# Patient Record
Sex: Female | Born: 1935 | Race: White | Hispanic: No | Marital: Single | Smoking: Former smoker
Health system: Southern US, Community
[De-identification: ages and names within clinical notes are randomized; demographics above are authoritative.]

## PROBLEM LIST (undated history)

## (undated) DIAGNOSIS — A879 Viral meningitis, unspecified: Secondary | ICD-10-CM

## (undated) DIAGNOSIS — I10 Essential (primary) hypertension: Secondary | ICD-10-CM

## (undated) HISTORY — PX: TONSILLECTOMY: SUR1361

---

## 2011-11-13 DIAGNOSIS — I10 Essential (primary) hypertension: Secondary | ICD-10-CM | POA: Diagnosis not present

## 2011-11-13 DIAGNOSIS — M549 Dorsalgia, unspecified: Secondary | ICD-10-CM | POA: Diagnosis not present

## 2011-11-13 DIAGNOSIS — B9789 Other viral agents as the cause of diseases classified elsewhere: Secondary | ICD-10-CM | POA: Diagnosis not present

## 2011-11-13 DIAGNOSIS — H73009 Acute myringitis, unspecified ear: Secondary | ICD-10-CM | POA: Diagnosis not present

## 2012-02-12 DIAGNOSIS — I10 Essential (primary) hypertension: Secondary | ICD-10-CM | POA: Diagnosis not present

## 2012-05-13 DIAGNOSIS — I10 Essential (primary) hypertension: Secondary | ICD-10-CM | POA: Diagnosis not present

## 2012-08-18 DIAGNOSIS — I10 Essential (primary) hypertension: Secondary | ICD-10-CM | POA: Diagnosis not present

## 2012-11-24 DIAGNOSIS — I10 Essential (primary) hypertension: Secondary | ICD-10-CM | POA: Diagnosis not present

## 2013-02-23 DIAGNOSIS — I10 Essential (primary) hypertension: Secondary | ICD-10-CM | POA: Diagnosis not present

## 2013-06-01 DIAGNOSIS — I10 Essential (primary) hypertension: Secondary | ICD-10-CM | POA: Diagnosis not present

## 2013-09-17 DIAGNOSIS — I10 Essential (primary) hypertension: Secondary | ICD-10-CM | POA: Diagnosis not present

## 2013-12-28 DIAGNOSIS — I10 Essential (primary) hypertension: Secondary | ICD-10-CM | POA: Diagnosis not present

## 2014-04-19 DIAGNOSIS — I10 Essential (primary) hypertension: Secondary | ICD-10-CM | POA: Diagnosis not present

## 2014-07-20 DIAGNOSIS — Z1331 Encounter for screening for depression: Secondary | ICD-10-CM | POA: Diagnosis not present

## 2014-07-20 DIAGNOSIS — Z0000 Encounter for general adult medical examination without abnormal findings: Secondary | ICD-10-CM | POA: Diagnosis not present

## 2014-07-20 DIAGNOSIS — I10 Essential (primary) hypertension: Secondary | ICD-10-CM | POA: Diagnosis not present

## 2014-10-21 DIAGNOSIS — I10 Essential (primary) hypertension: Secondary | ICD-10-CM | POA: Diagnosis not present

## 2014-10-21 DIAGNOSIS — M545 Low back pain: Secondary | ICD-10-CM | POA: Diagnosis not present

## 2015-01-21 DIAGNOSIS — I10 Essential (primary) hypertension: Secondary | ICD-10-CM | POA: Diagnosis not present

## 2015-01-21 DIAGNOSIS — M545 Low back pain: Secondary | ICD-10-CM | POA: Diagnosis not present

## 2015-01-21 DIAGNOSIS — Z131 Encounter for screening for diabetes mellitus: Secondary | ICD-10-CM | POA: Diagnosis not present

## 2015-04-22 DIAGNOSIS — I10 Essential (primary) hypertension: Secondary | ICD-10-CM | POA: Diagnosis not present

## 2015-04-22 DIAGNOSIS — M545 Low back pain: Secondary | ICD-10-CM | POA: Diagnosis not present

## 2015-07-25 DIAGNOSIS — I10 Essential (primary) hypertension: Secondary | ICD-10-CM | POA: Diagnosis not present

## 2015-07-25 DIAGNOSIS — Z1389 Encounter for screening for other disorder: Secondary | ICD-10-CM | POA: Diagnosis not present

## 2015-07-25 DIAGNOSIS — Z0000 Encounter for general adult medical examination without abnormal findings: Secondary | ICD-10-CM | POA: Diagnosis not present

## 2015-07-25 DIAGNOSIS — M545 Low back pain: Secondary | ICD-10-CM | POA: Diagnosis not present

## 2015-07-25 DIAGNOSIS — Z23 Encounter for immunization: Secondary | ICD-10-CM | POA: Diagnosis not present

## 2015-07-25 DIAGNOSIS — N181 Chronic kidney disease, stage 1: Secondary | ICD-10-CM | POA: Diagnosis not present

## 2015-08-02 DIAGNOSIS — Z1211 Encounter for screening for malignant neoplasm of colon: Secondary | ICD-10-CM | POA: Diagnosis not present

## 2015-10-24 DIAGNOSIS — M545 Low back pain: Secondary | ICD-10-CM | POA: Diagnosis not present

## 2015-10-24 DIAGNOSIS — N181 Chronic kidney disease, stage 1: Secondary | ICD-10-CM | POA: Diagnosis not present

## 2015-10-24 DIAGNOSIS — Z131 Encounter for screening for diabetes mellitus: Secondary | ICD-10-CM | POA: Diagnosis not present

## 2015-10-24 DIAGNOSIS — I10 Essential (primary) hypertension: Secondary | ICD-10-CM | POA: Diagnosis not present

## 2016-01-26 DIAGNOSIS — M545 Low back pain: Secondary | ICD-10-CM | POA: Diagnosis not present

## 2016-01-26 DIAGNOSIS — I10 Essential (primary) hypertension: Secondary | ICD-10-CM | POA: Diagnosis not present

## 2016-01-26 DIAGNOSIS — N181 Chronic kidney disease, stage 1: Secondary | ICD-10-CM | POA: Diagnosis not present

## 2016-08-23 ENCOUNTER — Inpatient Hospital Stay (HOSPITAL_COMMUNITY)
Admission: AD | Admit: 2016-08-23 | Discharge: 2016-08-27 | DRG: 071 | Disposition: A | Discharge status: Home Health | Payer: Medicare Other | Source: Other Acute Inpatient Hospital | Attending: Family Medicine | Admitting: Family Medicine

## 2016-08-23 ENCOUNTER — Encounter (HOSPITAL_COMMUNITY): Payer: Self-pay | Admitting: Internal Medicine

## 2016-08-23 DIAGNOSIS — I639 Cerebral infarction, unspecified: Secondary | ICD-10-CM | POA: Diagnosis not present

## 2016-08-23 DIAGNOSIS — R748 Abnormal levels of other serum enzymes: Secondary | ICD-10-CM | POA: Diagnosis not present

## 2016-08-23 DIAGNOSIS — I248 Other forms of acute ischemic heart disease: Secondary | ICD-10-CM | POA: Diagnosis present

## 2016-08-23 DIAGNOSIS — E860 Dehydration: Secondary | ICD-10-CM | POA: Diagnosis not present

## 2016-08-23 DIAGNOSIS — R4701 Aphasia: Secondary | ICD-10-CM | POA: Diagnosis present

## 2016-08-23 DIAGNOSIS — N390 Urinary tract infection, site not specified: Secondary | ICD-10-CM | POA: Diagnosis not present

## 2016-08-23 DIAGNOSIS — T17910A Gastric contents in respiratory tract, part unspecified causing asphyxiation, initial encounter: Secondary | ICD-10-CM | POA: Diagnosis not present

## 2016-08-23 DIAGNOSIS — G9340 Encephalopathy, unspecified: Secondary | ICD-10-CM | POA: Diagnosis not present

## 2016-08-23 DIAGNOSIS — J338 Other polyp of sinus: Secondary | ICD-10-CM | POA: Diagnosis not present

## 2016-08-23 DIAGNOSIS — R1110 Vomiting, unspecified: Secondary | ICD-10-CM | POA: Diagnosis not present

## 2016-08-23 DIAGNOSIS — G459 Transient cerebral ischemic attack, unspecified: Secondary | ICD-10-CM

## 2016-08-23 DIAGNOSIS — I10 Essential (primary) hypertension: Secondary | ICD-10-CM | POA: Diagnosis not present

## 2016-08-23 DIAGNOSIS — D72829 Elevated white blood cell count, unspecified: Secondary | ICD-10-CM | POA: Diagnosis not present

## 2016-08-23 DIAGNOSIS — D385 Neoplasm of uncertain behavior of other respiratory organs: Secondary | ICD-10-CM | POA: Diagnosis not present

## 2016-08-23 DIAGNOSIS — Z8249 Family history of ischemic heart disease and other diseases of the circulatory system: Secondary | ICD-10-CM | POA: Diagnosis not present

## 2016-08-23 DIAGNOSIS — J3489 Other specified disorders of nose and nasal sinuses: Secondary | ICD-10-CM | POA: Diagnosis not present

## 2016-08-23 DIAGNOSIS — R4182 Altered mental status, unspecified: Secondary | ICD-10-CM | POA: Diagnosis not present

## 2016-08-23 DIAGNOSIS — R29898 Other symptoms and signs involving the musculoskeletal system: Secondary | ICD-10-CM | POA: Diagnosis present

## 2016-08-23 DIAGNOSIS — J392 Other diseases of pharynx: Secondary | ICD-10-CM | POA: Diagnosis not present

## 2016-08-23 DIAGNOSIS — R778 Other specified abnormalities of plasma proteins: Secondary | ICD-10-CM | POA: Diagnosis present

## 2016-08-23 DIAGNOSIS — D491 Neoplasm of unspecified behavior of respiratory system: Secondary | ICD-10-CM | POA: Diagnosis not present

## 2016-08-23 DIAGNOSIS — R7989 Other specified abnormal findings of blood chemistry: Secondary | ICD-10-CM | POA: Diagnosis present

## 2016-08-23 DIAGNOSIS — I6789 Other cerebrovascular disease: Secondary | ICD-10-CM | POA: Diagnosis not present

## 2016-08-23 DIAGNOSIS — R4781 Slurred speech: Secondary | ICD-10-CM | POA: Diagnosis not present

## 2016-08-23 DIAGNOSIS — A879 Viral meningitis, unspecified: Secondary | ICD-10-CM | POA: Insufficient documentation

## 2016-08-23 DIAGNOSIS — R413 Other amnesia: Secondary | ICD-10-CM | POA: Diagnosis not present

## 2016-08-23 HISTORY — DX: Viral meningitis, unspecified: A87.9

## 2016-08-23 HISTORY — DX: Essential (primary) hypertension: I10

## 2016-08-23 LAB — URINE MICROSCOPIC-ADD ON

## 2016-08-23 LAB — URINALYSIS, ROUTINE W REFLEX MICROSCOPIC
Glucose, UA: NEGATIVE mg/dL
Hgb urine dipstick: NEGATIVE
Ketones, ur: 15 mg/dL — AB
LEUKOCYTES UA: NEGATIVE
NITRITE: NEGATIVE
PH: 6 (ref 5.0–8.0)
Protein, ur: 100 mg/dL — AB
SPECIFIC GRAVITY, URINE: 1.023 (ref 1.005–1.030)

## 2016-08-23 LAB — APTT: APTT: 36 s (ref 24–36)

## 2016-08-23 LAB — PROTIME-INR
INR: 1.05
PROTHROMBIN TIME: 13.7 s (ref 11.4–15.2)

## 2016-08-23 MED ORDER — ENOXAPARIN SODIUM 40 MG/0.4ML ~~LOC~~ SOLN
40.0000 mg | SUBCUTANEOUS | Status: DC
  Administered 2016-08-23 – 2016-08-26 (×4): 40 mg via SUBCUTANEOUS
  Filled 2016-08-23 (×4): qty 0.4

## 2016-08-23 MED ORDER — ASPIRIN 325 MG PO TABS
325.0000 mg | ORAL_TABLET | Freq: Every day | ORAL | Status: DC
  Administered 2016-08-23 – 2016-08-27 (×5): 325 mg via ORAL
  Filled 2016-08-23 (×5): qty 1

## 2016-08-23 MED ORDER — ACETAMINOPHEN 325 MG PO TABS
650.0000 mg | ORAL_TABLET | Freq: Four times a day (QID) | ORAL | Status: DC | PRN
  Administered 2016-08-24 – 2016-08-26 (×4): 650 mg via ORAL
  Filled 2016-08-23 (×4): qty 2

## 2016-08-23 MED ORDER — STROKE: EARLY STAGES OF RECOVERY BOOK
Freq: Once | Status: AC
  Administered 2016-08-23: 22:00:00

## 2016-08-23 MED ORDER — SODIUM CHLORIDE 0.9 % IV SOLN
INTRAVENOUS | Status: DC
  Administered 2016-08-23: 1000 mL via INTRAVENOUS
  Administered 2016-08-25 – 2016-08-26 (×2): via INTRAVENOUS

## 2016-08-23 MED ORDER — ZOLPIDEM TARTRATE 5 MG PO TABS: 5.0000 mg | ORAL_TABLET | Freq: Every evening | ORAL | Status: DC | PRN

## 2016-08-23 MED ORDER — ONDANSETRON HCL 4 MG/2ML IJ SOLN: 4.0000 mg | Freq: Three times a day (TID) | INTRAMUSCULAR | Status: DC | PRN

## 2016-08-23 MED ORDER — ASPIRIN 300 MG RE SUPP: 300.0000 mg | Freq: Every day | RECTAL | Status: DC

## 2016-08-23 MED ORDER — HYDRALAZINE HCL 20 MG/ML IJ SOLN: 5.0000 mg | INTRAMUSCULAR | Status: DC | PRN

## 2016-08-23 MED ORDER — SENNOSIDES-DOCUSATE SODIUM 8.6-50 MG PO TABS: 1.0000 | ORAL_TABLET | Freq: Every evening | ORAL | Status: DC | PRN

## 2016-08-23 NOTE — Progress Notes (Signed)
Pt arrived on unit from [facility] hospital via [place]. Pt confused on arrival with receptive and expressive aphasia. Pt's grand-daughter present with patient and made aware of call button and phone in unit.

## 2016-08-23 NOTE — H&P (Addendum)
History and Physical    Kristi Graham [id number] DOB: May 01, 1933 DOA: 08/23/2016  Referring MD/NP/PA:   PCP: No primary care provider on file.   Patient coming from:  The patient is coming from home.  At baseline, pt is independent for most of ADL. SNF  Assistant living facility   [facility].       Chief Complaint: AMS, slurred speech and right arm weakness  HPI: Kristi Graham is a 82 y.o. female with medical history significant of hypertension, viral meningitis, who presents with altered mental status, slurred speech and right arm weakness.  Pt is transferred [facility].   Per pt's daugher, at baseline patient is able to perform all of her ADLs and is checked on from time to time by family. Pt was last know normal yesterday afternoon. She was found down surrounded with vomit by family at about 10:00 AM, for an unknown period of time.  Pt is confused. Pt was noted to have right arm and leg weakness and slurred speech. Per accepting Doctor, Dr. Baker Janus note, "in the ED patient was found to have 3 out of 5 strength in her right upper extremity and right lower extremity with 5 out of 5 strength on the contralateral side". She dose not seem to have vision loss or hearing loss. Per family, patient was not taking her blood pressure medications because it made her dizzy recently. 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 MRI of brain is negative. MRA of brain is stable since 2012 and negative.   When I saw pt on the floor, she is still confused, denies any pain anywhere, No chest pain or abdominal pain. She moves all extremities. No active coughing, nausea, vomiting or diarrhea noted.  ED Course: pt was found to have  WBC 14.0, electrolytes and renal function okay, negative chest x-ray,  pending CT, temperature normal, slightly tachycardia. Pt is admitted to tele bed as inpt.  Review of Systems: Could not be reviewed due to altered mental status.  Allergy: Allergies not on file  Past Medical History:  Diagnosis Date  . Essential hypertension   . Viral meningitis     Past Surgical History:  Procedure Laterality Date  . TONSILLECTOMY      Social History:  Could not be reviewed due to altered mental status  Family History:  Family History  Problem Relation Age of Onset  . Dementia Mother   . Heart attack Father      Prior to Admission medications   Not on File    Physical Exam: Vitals:   08/23/16 2014 08/23/16 2200 08/24/16 0000 08/24/16 0200  BP: (!) 152/69 137/65 (!) 122/45 (!) 135/56  Pulse: 97 88 78 82  Resp: 18 18 16 18   Temp: 99.5 F (37.5 C) 99 F (37.2 C) 97.7 F (36.5 C) 99.1 F (37.3 C)  TempSrc: Oral Oral Oral Oral  SpO2: 99% 96% 97% 96%  Weight:   94.1 kg (207 lb 6.4 oz)   Height:   5\' 7"  (1.702 m)    General: Not in acute distress HEENT:       Eyes: PERRL, EOMI, no scleral icterus.       ENT: No discharge from the ears and nose, no pharynx injection, no tonsillar enlargement.        Neck: No JVD, no bruit, no  mass felt. Heme: No neck lymph node enlargement. Cardiac: S1/S2, RRR, No murmurs, No gallops or rubs. Respiratory: No rales, wheezing, rhonchi or rubs. GI: Soft, nondistended, nontender, no rebound pain, no organomegaly, BS present. GU: No hematuria Ext: No pitting leg edema bilaterally. 2+DP/PT pulse bilaterally. Musculoskeletal: No joint deformities, No joint redness or warmth, no limitation of ROM in spin. Skin: No rashes.  Neuro: confused, not oriented X3, cranial nerves II-XII grossly intact, moves all extremities normally. Muscle strength 5/5 in all extremities.  Knee reflex 1+ bilaterally. Negative Babinski's sign. Neck supple. Psych: Could not be reviewed due to altered mental status.  Labs on Admission: I have  personally reviewed following labs and imaging studies  CBC: No results for input(s): WBC, NEUTROABS, HGB, HCT, MCV, PLT in the last 168 hours. Basic Metabolic Panel: No results for input(s): NA, K, CL, CO2, GLUCOSE, BUN, CREATININE, CALCIUM, MG, PHOS in the last 168 hours. GFR: CrCl cannot be calculated (No order found.). Liver Function Tests: No results for input(s): AST, ALT, ALKPHOS, BILITOT, PROT, ALBUMIN in the last 168 hours.  Recent Labs Lab 08/23/16 2247  LIPASE 22   No results for input(s): AMMONIA in the last 168 hours. Coagulation Profile:  Recent Labs Lab 08/23/16 2247  INR 1.05   Cardiac Enzymes:  Recent Labs Lab 08/23/16 2247  TROPONINI 0.03*   BNP (last 3 results) No results for input(s): PROBNP in the last 8760 hours. HbA1C: No results for input(s): HGBA1C in the last 72 hours. CBG: No results for input(s): GLUCAP in the last 168 hours. Lipid Profile:  Recent Labs  08/24/16 0205  CHOL 159  HDL 65  LDLCALC 85  TRIG 47  CHOLHDL 2.4   Thyroid Function Tests: No results for input(s): TSH, T4TOTAL, FREET4, T3FREE, THYROIDAB in the last 72 hours. Anemia Panel: No results for input(s): VITAMINB12, FOLATE, FERRITIN, TIBC, IRON, RETICCTPCT in the last 72 hours. Urine analysis:    Component Value Date/Time   COLORURINE AMBER (A) 08/23/2016 2255   APPEARANCEUR CLOUDY (A) 08/23/2016 2255   LABSPEC 1.023 08/23/2016 2255   PHURINE 6.0 08/23/2016 2255   GLUCOSEU NEGATIVE 08/23/2016 2255   HGBUR NEGATIVE 08/23/2016 2255   BILIRUBINUR SMALL (A) 08/23/2016 2255   KETONESUR 15 (A) 08/23/2016 2255   PROTEINUR 100 (A) 08/23/2016 2255   NITRITE NEGATIVE 08/23/2016 2255   LEUKOCYTESUR NEGATIVE 08/23/2016 2255   Sepsis Labs: @LABRCNTIP (procalcitonin:4,lacticidven:4) )No results found for this or any previous visit (from the past 240 hour(s)).   Radiological Exams on Admission: No results found.   EKG: Independently reviewed.QTC 498, tachycardia,  LAD, anteroseptal infarction pattern.  Assessment/Plan Principal Problem:   Acute encephalopathy Active Problems:   Essential hypertension   Receptive aphasia   Right arm weakness   Leukocytosis   Elevated troponin   Acute encephalopathy, right arm weakness and receptive aphasia: Etiology is not clear. MRI/MRA did not show acute new issues intracranially. Potential differential diagnosis includes TIA, seizure. Patient does not have fever, neck supple, less likely to have meningitis. --Admitted to telemetry bed as inpatient -TIA workup - Risk factor modification: HgbA1c, fasting lipid panel - PT consult, OT consult - Bedside swallowing screen was ordered, will get speech consult in AM - 2 d Echocardiogram  - Carotid dopplers  - check EEG - please call neurology in morning  Essential hypertension: -hold amlodipin due to AMS -IV hydralazine when necessary  Leukocytosis: Etiology is not clear. CXR is negative, urinalysis negative. May be due to stress-induced demargination -Follow-up CBC  Elevated  troponin: trop 0.03. Likely due to demand ischemia. No chest pain. 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 -Please call ENT for consultation   DVT ppx: SQ Heparin   Code Status: Full code Family Communication: Yes, patient's  Daughter, sister and granddaughter  at bed side Disposition Plan:  Anticipate discharge back to previous home environment Consults called:  none Admission status: Inpatient/tele    Date of Service 08/24/2016    Ivor Costa [facility] Pager [phone]  If 7PM-7AM, please contact night-coverage [email or web address] Password [email or web address] 08/24/2016, 3:39 AM

## 2016-08-24 ENCOUNTER — Inpatient Hospital Stay (HOSPITAL_COMMUNITY): Payer: Medicare Other

## 2016-08-24 ENCOUNTER — Inpatient Hospital Stay (HOSPITAL_COMMUNITY)
Admission: AD | Admit: 2016-08-24 | Discharge: 2016-08-24 | Disposition: A | Discharge status: Home / Self Care | Payer: Medicare Other | Source: Other Acute Inpatient Hospital | Attending: Internal Medicine | Admitting: Internal Medicine

## 2016-08-24 ENCOUNTER — Encounter (HOSPITAL_COMMUNITY): Payer: Self-pay | Admitting: Internal Medicine

## 2016-08-24 DIAGNOSIS — R4182 Altered mental status, unspecified: Secondary | ICD-10-CM

## 2016-08-24 DIAGNOSIS — G9340 Encephalopathy, unspecified: Secondary | ICD-10-CM

## 2016-08-24 DIAGNOSIS — R778 Other specified abnormalities of plasma proteins: Secondary | ICD-10-CM | POA: Diagnosis present

## 2016-08-24 DIAGNOSIS — R748 Abnormal levels of other serum enzymes: Secondary | ICD-10-CM

## 2016-08-24 DIAGNOSIS — R7989 Other specified abnormal findings of blood chemistry: Secondary | ICD-10-CM

## 2016-08-24 LAB — CBC WITH DIFFERENTIAL/PLATELET
BASOS PCT: 1 %
Basophils Absolute: 0.1 10*3/uL (ref 0.0–0.1)
EOS ABS: 0.1 10*3/uL (ref 0.0–0.7)
Eosinophils Relative: 1 %
HCT: 38 % (ref 36.0–46.0)
HEMOGLOBIN: 12.1 g/dL (ref 12.0–15.0)
Lymphocytes Relative: 26 %
Lymphs Abs: 3 10*3/uL (ref 0.7–4.0)
MCH: 26.1 pg (ref 26.0–34.0)
MCHC: 31.8 g/dL (ref 30.0–36.0)
MCV: 82.1 fL (ref 78.0–100.0)
MONO ABS: 0.7 10*3/uL (ref 0.1–1.0)
MONOS PCT: 6 %
NEUTROS PCT: 66 %
Neutro Abs: 7.7 10*3/uL (ref 1.7–7.7)
Platelets: 308 10*3/uL (ref 150–400)
RBC: 4.63 MIL/uL (ref 3.87–5.11)
RDW: 15.5 % (ref 11.5–15.5)
WBC: 11.5 10*3/uL — ABNORMAL HIGH (ref 4.0–10.5)

## 2016-08-24 LAB — COMPREHENSIVE METABOLIC PANEL WITH GFR
ALT: 24 U/L (ref 14–54)
AST: 58 U/L — ABNORMAL HIGH (ref 15–41)
Albumin: 3.7 g/dL (ref 3.5–5.0)
Alkaline Phosphatase: 54 U/L (ref 38–126)
Anion gap: 9 (ref 5–15)
BUN: 29 mg/dL — ABNORMAL HIGH (ref 6–20)
CO2: 23 mmol/L (ref 22–32)
Calcium: 9 mg/dL (ref 8.9–10.3)
Chloride: 104 mmol/L (ref 101–111)
Creatinine, Ser: 1.11 mg/dL — ABNORMAL HIGH (ref 0.44–1.00)
GFR calc Af Amer: 53 mL/min — ABNORMAL LOW
GFR calc non Af Amer: 46 mL/min — ABNORMAL LOW
Glucose, Bld: 116 mg/dL — ABNORMAL HIGH (ref 65–99)
Potassium: 3.4 mmol/L — ABNORMAL LOW (ref 3.5–5.1)
Sodium: 136 mmol/L (ref 135–145)
Total Bilirubin: 0.8 mg/dL (ref 0.3–1.2)
Total Protein: 7.2 g/dL (ref 6.5–8.1)

## 2016-08-24 LAB — TROPONIN I
TROPONIN I: 0.03 ng/mL — AB (ref <0.03)
Troponin I: 0.03 ng/mL (ref <0.03)

## 2016-08-24 LAB — AMMONIA: Ammonia: 21 umol/L (ref 9–35)

## 2016-08-24 LAB — HEMOGLOBIN A1C
Hgb A1c MFr Bld: 5.7 % — ABNORMAL HIGH (ref 4.8–5.6)
MEAN PLASMA GLUCOSE: 117 mg/dL

## 2016-08-24 LAB — LIPID PANEL
CHOL/HDL RATIO: 2.4 ratio
CHOLESTEROL: 159 mg/dL (ref 0–200)
HDL: 65 mg/dL (ref >40)
LDL Cholesterol: 85 mg/dL (ref 0–99)
TRIGLYCERIDES: 47 mg/dL (ref <150)
VLDL: 9 mg/dL (ref 0–40)

## 2016-08-24 LAB — VITAMIN B12: VITAMIN B 12: 215 pg/mL (ref 180–914)

## 2016-08-24 LAB — GLUCOSE, CAPILLARY: Glucose-Capillary: 96 mg/dL (ref 65–99)

## 2016-08-24 LAB — LIPASE, BLOOD: Lipase: 22 U/L (ref 11–51)

## 2016-08-24 LAB — TSH: TSH: 1.031 u[IU]/mL (ref 0.350–4.500)

## 2016-08-24 MED ORDER — NITROGLYCERIN 0.4 MG SL SUBL: 0.4000 mg | SUBLINGUAL_TABLET | SUBLINGUAL | Status: DC | PRN

## 2016-08-24 MED ORDER — MORPHINE SULFATE (PF) 2 MG/ML IV SOLN: 2.0000 mg | INTRAVENOUS | Status: DC | PRN

## 2016-08-24 NOTE — Progress Notes (Signed)
PROGRESS NOTE    Kristi Graham  [id number] DOB: July 08, 1933 DOA: 08/23/2016 PCP: No primary care provider on file.    Brief Narrative:  Kristi Graham is a 82 y.o. female with medical history significant of hypertension, viral meningitis, who presents with altered mental status, slurred speech and right arm weakness.  Pt is transferred [facility].   Per pt's daugher, at baseline patient is able to perform all of her ADLs and is checked on from time to time by family. Pt was last know normal yesterday afternoon. She was found down surrounded with vomit by family at about 10:00 AM, for an unknown period of time.  Pt is confused. Pt was noted to have right arm and leg weakness and slurred speech. Per accepting Doctor, Dr. Baker Janus note, "in the ED patient was found to have 3 out of 5 strength in her right upper extremity and right lower extremity with 5 out of 5 strength on the contralateral side". She dose not seem to have vision loss or hearing loss. Per family, patient was not taking her blood pressure medications because it made her dizzy recently. 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 MRI of brain is negative. MRA of brain is stable since 2012 and negative.  Upon arrival patient is still confused, denies any pain anywhere, No chest pain or abdominal pain. She moves all extremities. No active coughing, nausea, vomiting or diarrhea noted.  Patient was found to have  WBC 14.0, electrolytes and renal function okay, negative chest x-ray, pending CT, temperature normal, slightly tachycardia. Pt is admitted to tele bed as inpt.    Assessment & Plan:   Principal Problem:   Acute encephalopathy Active Problems:   Essential hypertension   Receptive aphasia   Right arm weakness  Leukocytosis   Elevated troponin   Acute encephalopathy, right arm weakness and receptive aphasia: Etiology is not clear. MRI/MRA did not show acute new issues intracranially. Potential differential diagnosis includes TIA, seizure. Patient does not have fever, neck supple, less likely to have meningitis. - Admitted to telemetry bed as inpatient - TIA workup - Risk factor modification: HgbA1c, fasting lipid panel - PT consult, OT consult - 2 d Echocardiogram pending - Carotid dopplers pending - check EEG - Neurology consulted - vitamin B12 ordered - repeat MRI - TSH and RPR ordered  Essential hypertension: -hold amlodipine due to AMS -IV hydralazine when necessary  Leukocytosis: Etiology is not clear. CXR is negative, urinalysis negative. May be due to stress-induced demargination - WBC at 11.5 this morning  Elevated troponin: trop 0.03. Likely due to demand ischemia. No chest pain. 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 -ENT consulted- Dr. Melrose Nakayama to see patient   DVT ppx: SQ Heparin   Code Status: Full code Family Communication: Yes, patient's  Daughter, sister and granddaughter  at bed side Disposition Plan:  unclear at this time- pending therapy recommendations   Consultants:   Neurology  ENT  PT/OT  Procedures:   none  Antimicrobials:   none    Subjective: Patient seen and evaluated this morning.  She was confused and unable to follow two step directions.  Unable to provide a history.  Granddaughter is bedside. Mass found on CT scan at [facility] in maxillary sinus but not reported on MRI.  No respiratory compromise.   Objective: Vitals:   08/24/16 0400 08/24/16 0600 08/24/16 1038 08/24/16 1415  BP: (!)  126/58 (!) 122/55 (!) 147/56 130/62  Pulse: 89 79 74 77  Resp: 18 18 16 18   Temp: 98.1 F (36.7 C) 97.7 F (36.5 C) 97.8 F (36.6 C) 98 F (36.7 C)  TempSrc: Oral Oral Oral Oral  SpO2: 97% 97% 95%   Weight:      Height:        Intake/Output Summary (Last 24 hours) at 08/24/16 1542 Last data filed at 08/24/16 1038  Gross per 24 hour  Intake             1260 ml  Output              200 ml  Net             1060 ml   Filed Weights   08/24/16 0000  Weight: 94.1 kg (207 lb 6.4 oz)    Examination:  General exam: Appears calm and comfortable  Respiratory system: Clear to auscultation. Respiratory effort normal. Cardiovascular system: S1 & S2 heard, RRR. No JVD, murmurs, rubs, gallops or clicks. No pedal edema. Gastrointestinal system: Abdomen is nondistended, soft and nontender. No organomegaly or masses felt. Normal bowel sounds heard. Central nervous system: Alert but not oriented. Questionable deficits but patient not fully participatory in exam. Extremities: patient seems to be able to move upper and lower extremities spontaneously and could flex and extend at elbow and wrist and ankle but could not follow commands Skin: No rashes, lesions or ulcers Psychiatry: limited insight. Mood & affect appropriate.     Data Reviewed: I have personally reviewed following labs and imaging studies  CBC:  Recent Labs Lab 08/24/16 1134  WBC 11.5*  NEUTROABS 7.7  HGB 12.1  HCT 38.0  MCV 82.1  PLT [id number]   Basic Metabolic Panel:  Recent Labs Lab 08/24/16 1134  NA 136  K 3.4*  CL 104  CO2 23  GLUCOSE 116*  BUN 29*  CREATININE 1.11*  CALCIUM 9.0   GFR: Estimated Creatinine Clearance: 47.6 mL/min (by C-G formula based on SCr of 1.11 mg/dL (H)). Liver Function Tests:  Recent Labs Lab 08/24/16 1134  AST 58*  ALT 24  ALKPHOS 54  BILITOT 0.8  PROT 7.2  ALBUMIN 3.7    Recent Labs Lab 08/23/16 2247  LIPASE 22    Recent Labs Lab 08/24/16 1134  AMMONIA 21    Coagulation Profile:  Recent Labs Lab 08/23/16 2247  INR 1.05   Cardiac Enzymes:  Recent Labs Lab 08/23/16 2247 08/24/16 0315 08/24/16 1134 08/24/16 1357  TROPONINI 0.03* 0.03* <0.03 <0.03   BNP (last 3 results) No results for input(s): PROBNP in the last 8760 hours. HbA1C: No results for input(s): HGBA1C in the last 72 hours. CBG:  Recent Labs Lab 08/24/16 0753  GLUCAP 96   Lipid Profile:  Recent Labs  08/24/16 0205  CHOL 159  HDL 65  LDLCALC 85  TRIG 47  CHOLHDL 2.4   Thyroid Function Tests:  Recent Labs  08/24/16 1134  TSH 1.031   Anemia Panel:  Recent Labs  08/24/16 1357  VITAMINB12 215   Sepsis Labs: No results for input(s): PROCALCITON, LATICACIDVEN in the last 168 hours.  No results found for this or any previous visit (from  the past 240 hour(s)).       Radiology Studies: No results found.      Scheduled Meds: . aspirin  300 mg Rectal Daily   Or  . aspirin  325 mg Oral Daily  . enoxaparin (LOVENOX) injection  40 mg Subcutaneous Q24H   Continuous Infusions: . sodium chloride 1,000 mL (08/23/16 2148)     LOS: 1 day    Time spent: 35 minutes    Newman Pies, MD [facility] Pager [phone]  If 7PM-7AM, please contact night-coverage [email or web address] Password [email or web address] 08/24/2016, 3:42 PM

## 2016-08-24 NOTE — Progress Notes (Signed)
PT Cancellation Note  Patient Details Name: Lorenza Propes MRN: [id number] DOB: 25-Mar-1933   Cancelled Treatment:    Reason Eval/Treat Not Completed: Patient not medically ready Pt on bedrest. Will await increase in activity orders prior to initiation of PT evaluation. Will follow.   Marguarite Arbour A Shauniece Kwan 08/24/2016, 8:32 AM Wray Kearns, PT, DPT [phone]

## 2016-08-24 NOTE — Progress Notes (Signed)
Preliminary results by tech: Carotid duplex complete. Right 1-39 percent ICA stenosis. Left 40-59 percent stenosis. Vertebral arteries antegrade bilaterally. [place], RVT 3:56 PM  08/24/2016

## 2016-08-24 NOTE — Consult Note (Signed)
Reason for Consult right nasal mass Referring Physician: hospitalist  Kristi Graham is an 82 y.o. female.  HPI: Asked to see patient for a mass in the right maxillary sinus and ethmoid which was seen on CT and MRI scan with her stroke workup. She's recently been admitted for evaluation of mental status changes. She did not have clear evidence of stroke but has had changes that even worsened over the day today. She was referred from [facility]. She does have headaches and pressure in her face specifically on that side. She has had episodes of sinusitis in the past. The daughter states that she is bothered by the persisting congestion and pressure symptoms.  Past Medical History:  Diagnosis Date  . Essential hypertension   . Viral meningitis     Past Surgical History:  Procedure Laterality Date  . TONSILLECTOMY      Family History  Problem Relation Age of Onset  . Dementia Mother   . Heart attack Father     Social History:  reports that she has never smoked. She does not have any smokeless tobacco history on file. She reports that she does not drink alcohol or use drugs.  Allergies: No Known Allergies  Medications: I have reviewed the patient's current medications.  Results for orders placed or performed during the hospital encounter of 08/23/16 (from the past 48 hour(s))  Lipase, blood     Status: None   Collection Time: 08/23/16 10:47 PM  Result Value Ref Range   Lipase 22 11 - 51 U/L  Troponin I (q 6hr x 3)     Status: Abnormal   Collection Time: 08/23/16 10:47 PM  Result Value Ref Range   Troponin I 0.03 (HH) <0.03 ng/mL    Comment: CRITICAL RESULT CALLED TO, READ BACK BY AND VERIFIED WITH: BUSSEY,C RN 08/24/2016 0007 JORDANS   Protime-INR     Status: None   Collection Time: 08/23/16 10:47 PM  Result Value Ref Range   Prothrombin Time 13.7 11.4 - 15.2 seconds   INR 1.05   APTT     Status: None   Collection Time: 08/23/16 10:47 PM  Result Value Ref Range    aPTT 36 24 - 36 seconds  Urinalysis, Routine w reflex microscopic (not at [facility])     Status: Abnormal   Collection Time: 08/23/16 10:55 PM  Result Value Ref Range   Color, Urine AMBER (A) YELLOW    Comment: BIOCHEMICALS MAY BE AFFECTED BY COLOR   APPearance CLOUDY (A) CLEAR   Specific Gravity, Urine 1.023 1.005 - 1.030   pH 6.0 5.0 - 8.0   Glucose, UA NEGATIVE NEGATIVE mg/dL   Hgb urine dipstick NEGATIVE NEGATIVE   Bilirubin Urine SMALL (A) NEGATIVE   Ketones, ur 15 (A) NEGATIVE mg/dL   Protein, ur 100 (A) NEGATIVE mg/dL   Nitrite NEGATIVE NEGATIVE   Leukocytes, UA NEGATIVE NEGATIVE  Urine microscopic-add on     Status: Abnormal   Collection Time: 08/23/16 10:55 PM  Result Value Ref Range   Squamous Epithelial / LPF 6-30 (A) NONE SEEN   WBC, UA 0-5 0 - 5 WBC/hpf   RBC / HPF 0-5 0 - 5 RBC/hpf   Bacteria, UA FEW (A) NONE SEEN   Casts HYALINE CASTS (A) NEGATIVE   Urine-Other MUCOUS PRESENT   Lipid panel     Status: None   Collection Time: 08/24/16  2:05 AM  Result Value Ref Range   Cholesterol 159 0 - 200 mg/dL   Triglycerides 47 <  150 mg/dL   HDL 65 >40 mg/dL   Total CHOL/HDL Ratio 2.4 RATIO   VLDL 9 0 - 40 mg/dL   LDL Cholesterol 85 0 - 99 mg/dL    Comment:        Total Cholesterol/HDL:CHD Risk Coronary Heart Disease Risk Table                     Men   Women  1/2 Average Risk   3.4   3.3  Average Risk       5.0   4.4  2 X Average Risk   9.6   7.1  3 X Average Risk  23.4   11.0        Use the calculated Patient Ratio above and the CHD Risk Table to determine the patient's CHD Risk.        ATP III CLASSIFICATION (LDL):  <100     mg/dL   Optimal  100-129  mg/dL   Near or Above                    Optimal  130-159  mg/dL   Borderline  160-189  mg/dL   High  >190     mg/dL   Very High   Troponin I (q 6hr x 3)     Status: Abnormal   Collection Time: 08/24/16  3:15 AM  Result Value Ref Range   Troponin I 0.03 (HH) <0.03 ng/mL    Comment: CRITICAL VALUE NOTED.  VALUE  IS CONSISTENT WITH PREVIOUSLY REPORTED AND CALLED VALUE.  Glucose, capillary     Status: None   Collection Time: 08/24/16  7:53 AM  Result Value Ref Range   Glucose-Capillary 96 65 - 99 mg/dL  Troponin I (q 6hr x 3)     Status: None   Collection Time: 08/24/16 11:34 AM  Result Value Ref Range   Troponin I <0.03 <0.03 ng/mL  CBC with Differential/Platelet     Status: Abnormal   Collection Time: 08/24/16 11:34 AM  Result Value Ref Range   WBC 11.5 (H) 4.0 - 10.5 K/uL   RBC 4.63 3.87 - 5.11 MIL/uL   Hemoglobin 12.1 12.0 - 15.0 g/dL   HCT 38.0 36.0 - 46.0 %   MCV 82.1 78.0 - 100.0 fL   MCH 26.1 26.0 - 34.0 pg   MCHC 31.8 30.0 - 36.0 g/dL   RDW 15.5 11.5 - 15.5 %   Platelets 308 150 - 400 K/uL   Neutrophils Relative % 66 %   Neutro Abs 7.7 1.7 - 7.7 K/uL   Lymphocytes Relative 26 %   Lymphs Abs 3.0 0.7 - 4.0 K/uL   Monocytes Relative 6 %   Monocytes Absolute 0.7 0.1 - 1.0 K/uL   Eosinophils Relative 1 %   Eosinophils Absolute 0.1 0.0 - 0.7 K/uL   Basophils Relative 1 %   Basophils Absolute 0.1 0.0 - 0.1 K/uL  Comprehensive metabolic panel     Status: Abnormal   Collection Time: 08/24/16 11:34 AM  Result Value Ref Range   Sodium 136 135 - 145 mmol/L   Potassium 3.4 (L) 3.5 - 5.1 mmol/L   Chloride 104 101 - 111 mmol/L   CO2 23 22 - 32 mmol/L   Glucose, Bld 116 (H) 65 - 99 mg/dL   BUN 29 (H) 6 - 20 mg/dL   Creatinine, Ser 1.11 (H) 0.44 - 1.00 mg/dL   Calcium 9.0 8.9 - 10.3 mg/dL   Total Protein  7.2 6.5 - 8.1 g/dL   Albumin 3.7 3.5 - 5.0 g/dL   AST 58 (H) 15 - 41 U/L   ALT 24 14 - 54 U/L   Alkaline Phosphatase 54 38 - 126 U/L   Total Bilirubin 0.8 0.3 - 1.2 mg/dL   GFR calc non Af Amer 46 (L) >60 mL/min   GFR calc Af Amer 53 (L) >60 mL/min    Comment: (NOTE) The eGFR has been calculated using the CKD EPI equation. This calculation has not been validated in all clinical situations. eGFR's persistently <60 mL/min signify possible Chronic Kidney Disease.    Anion gap 9 5 -  15  Ammonia     Status: None   Collection Time: 08/24/16 11:34 AM  Result Value Ref Range   Ammonia 21 9 - 35 umol/L  TSH     Status: None   Collection Time: 08/24/16 11:34 AM  Result Value Ref Range   TSH 1.031 0.350 - 4.500 uIU/mL    Comment: Performed by a 3rd Generation assay with a functional sensitivity of <=0.01 uIU/mL.  Troponin I (q 6hr x 3)     Status: None   Collection Time: 08/24/16  1:57 PM  Result Value Ref Range   Troponin I <0.03 <0.03 ng/mL  Vitamin B12     Status: None   Collection Time: 08/24/16  1:57 PM  Result Value Ref Range   Vitamin B-12 215 180 - 914 pg/mL    Comment: (NOTE) This assay is not validated for testing neonatal or myeloproliferative syndrome specimens for Vitamin B12 levels.     Mr Jeri Cos Wo Contrast  Result Date: 08/24/2016 CLINICAL DATA:  Sudden onset confusion. Possible right-sided hemianopsia. EXAM: MRI HEAD WITHOUT AND WITH CONTRAST TECHNIQUE: Multiplanar, multiecho pulse sequences of the brain and surrounding structures were obtained without and with intravenous contrast. CONTRAST:  19 mL MultiHance COMPARISON:  Noncontrast brain MRI 08/23/2016 FINDINGS: Some sequences are mildly motion degraded. Brain: There is no evidence of acute infarct, intracranial hemorrhage, mass, midline shift, or extra-axial fluid collection. Mild generalized cerebral atrophy is within normal limits for age. Minimal cerebral white matter T2 signal changes are within normal limits for age. No abnormal enhancement is identified. Vascular: Major intracranial vascular flow voids are preserved. Skull and upper cervical spine: Mild nonspecific diffuse bone marrow heterogeneity. No destructive osseous lesion identified. Sinuses/Orbits: Unremarkable orbits. Minimal right frontal and mild right ethmoid sinus mucosal thickening. 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 Other:  None. IMPRESSION: Unremarkable appearance of the brain for age. Electronically Signed   By: Logan Bores M.D.   On: 08/24/2016 16:43    ROS Blood pressure 130/62, pulse 77, temperature 98 F (36.7 C), temperature source Oral, resp. rate 18, height 5' 7"  (1.702 m), weight 94.1 kg (207 lb 6.4 oz), SpO2 95 %. Physical Exam  Constitutional: She appears well-developed and well-nourished.  HENT:  Head: Normocephalic and atraumatic.  Mouth/Throat: Oropharynx is clear and moist.  Eyes: Conjunctivae are normal. Pupils are equal, round, and reactive to light.  Neck: Normal range of motion. Neck supple.    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 It has been present at least since 2012 based on her MRI scan review. It does not appear there is any significant bone erosion currently. She does have a lot of symptoms. The daughter was discussing this and wants to have it removed while she is  in the hospital. I discussed this with the hospital physician and they want to continue the workup and try to find out what her acute mental status change is prior to proceeding with a general anesthesia. She will need a fusion CT scan if that endoscopic sinus surgery removal does become an option. I will follow along and see how her status is through the weekend. Marland KitchenMelissa Montane 08/24/2016, 4:59 PM

## 2016-08-24 NOTE — Progress Notes (Signed)
PT Cancellation Note  Patient Details Name: Kristi Graham MRN: [id number] DOB: [birth year]   Cancelled Treatment:    Reason Eval/Treat Not Completed: Patient at procedure or test/unavailable Pt off floor at test. Will follow up next available time.   Marguarite Arbour A Tracyann Duffell 08/24/2016, 10:26 AM Wray Kearns, [place], DPT [phone]

## 2016-08-24 NOTE — Evaluation (Signed)
Physical Therapy Evaluation Patient Details Name: Maelani James MRN: [id number] DOB: 05/06/1933 Today's Date: 08/24/2016   History of Present Illness  Patient is a 82 y/o female with hx of essential HTN and viral meningitis presents with AMS, slurred speech and right arm weakness. MRI-negative. Workup pending.  Clinical Impression  Patient presents with confusion- demonstrating impaired safety awareness, difficulty following 2-3 step commands, impulsivity, and impaired awareness of situation. Per MD notes, pt independent PTA with family checking in on her daily. Today, pt with unsafe gait speed, running into doorways on left, difficulty following directional instructions and oriented x1-2. Concern about problem solving. Pt not safe to return home alone. Will need 24/7 supervision for safety. Would benefit from conversations about ALF and follow up with neurology. Will follow acutely to maximize independence and safe mobility.     Follow Up Recommendations Supervision for mobility/OOB;Supervision/Assistance - 24 hour    Equipment Recommendations  None recommended by PT    Recommendations for Other Services OT consult     Precautions / Restrictions Precautions Precautions: Fall Restrictions Weight Bearing Restrictions: No      Mobility  Bed Mobility               General bed mobility comments: Up in chair upon PT arrival.   Transfers Overall transfer level: Needs assistance Equipment used: None Transfers: Sit to/from Stand Sit to Stand: Supervision         General transfer comment: Supervision for safety due to impulsivity, no physical assist needed.  Ambulation/Gait Ambulation/Gait assistance: Min assist Ambulation Distance (Feet): 150 Feet Assistive device: None Gait Pattern/deviations: Staggering left;Staggering right;Step-through pattern Gait velocity: unsafe speed Gait velocity interpretation: at or above normal speed for age/gender General Gait Details:  Increased speed, unsafe. Bumping into doorway on left x2, difficutly following directional instructions requiring tactile cues. Staggering noted but no overt LOB.  Stairs Stairs: Yes Stairs assistance: Min guard Stair Management: Alternating pattern;Step to pattern;Two rails Number of Stairs: 3 (+ 2 steps x2 bouts) General stair comments: Cues for safety.   Wheelchair Mobility    Modified Rankin (Stroke Patients Only)       Balance Overall balance assessment: Needs assistance;History of Falls Sitting-balance support: Feet supported;No upper extremity supported Sitting balance-Leahy Scale: Good     Standing balance support: During functional activity Standing balance-Leahy Scale: Fair                               Pertinent Vitals/Pain Pain Assessment: No/denies pain    Home Living Family/patient expects to be discharged to:: Private residence Living Arrangements: Alone Available Help at Discharge: Family;Available 24 hours/day (sister reports she will be staying with her at d/c.) Type of Home: House Home Access: Level entry     Home Layout: One level Home Equipment: None      Prior Function Level of Independence: Independent         Comments: Pt reports she drives, cooks, cleans and does everything. Not sure of accuracy of reported PLOF as pt seems confused.      Hand Dominance   Dominant Hand: Right    Extremity/Trunk Assessment   Upper Extremity Assessment: Defer to OT evaluation           Lower Extremity Assessment: LLE deficits/detail   LLE Deficits / Details: Grossly ~4/5 throughout.     Communication   Communication: No difficulties  Cognition Arousal/Alertness: Awake/alert Behavior During Therapy: Impulsive Overall Cognitive Status: Impaired/Different  from baseline Area of Impairment: Orientation;Following commands;Safety/judgement;Awareness;Problem solving Orientation Level: Disoriented to;Time;Situation (knows she is in  [place]; repeating "17" when asked the month on numerous occasions despite verbal cues; reports she did not fall and does not know why she is here.)   Memory: Decreased short-term memory Following Commands: Follows one step commands with increased time;Follows one step commands inconsistently (despite multi modal cues and redirection.) Safety/Judgement: Decreased awareness of safety;Decreased awareness of deficits Awareness: Intellectual Problem Solving: Requires verbal cues;Requires tactile cues;Slow processing General Comments: Pt with very poor safety awareness; running into doorways on her left x2. Question hard of hearing? vs problem solving.    General Comments General comments (skin integrity, edema, etc.): Sister present during session. Sister seems to think that patient is baseline.     Exercises     Assessment/Plan    PT Assessment Patient needs continued PT services  PT Problem List Decreased strength;Decreased mobility;Decreased safety awareness;Decreased balance;Decreased cognition          PT Treatment Interventions Therapeutic activities;Cognitive remediation;Gait training;Therapeutic exercise;Patient/family education;Balance training;Functional mobility training;Neuromuscular re-education;Stair training    PT Goals (Current goals can be found in the Care Plan section)  Acute Rehab PT Goals Patient Stated Goal: to go home PT Goal Formulation: With patient Time For Goal Achievement: 09/07/16 Potential to Achieve Goals: Good    Frequency Min 3X/week   Barriers to discharge Decreased caregiver support lives alone    Co-evaluation               End of Session Equipment Utilized During Treatment: Gait belt Activity Tolerance: Patient tolerated treatment well Patient left: in chair;with call bell/phone within reach;with chair alarm set;with family/visitor present Nurse Communication: Mobility status         Time: 1135-1200 PT Time Calculation (min)  (ACUTE ONLY): 25 min   Charges:   PT Evaluation $PT Eval Moderate Complexity: 1 Procedure PT Treatments $Gait Training: 8-22 mins   PT G Codes:        Syrai Gladwin A Defne Gerling 08/24/2016, 12:22 PM Wray Kearns, [place], DPT [phone]

## 2016-08-24 NOTE — Progress Notes (Signed)
OT Cancellation Note  Patient Details Name: Kristi Graham MRN: [id number] DOB: 27-Jun-1933   Cancelled Treatment:    Reason Eval/Treat Not Completed: Patient at procedure or test/ unavailable (Will follow.)  [name] 08/24/2016, 2:36 PM  [phone]

## 2016-08-24 NOTE — Progress Notes (Signed)
EEG Completed; Results Pending  

## 2016-08-24 NOTE — Consult Note (Signed)
NEURO HOSPITALIST CONSULT NOTE   Requestig physician: Dr. Jearld Shines   Reason for Consult: confusion.   History obtained from:  Patient     HPI:                                                                                                                                          Kristi Graham is an 83 y.o. female with history of viral meningitis 5 years ago and essential hypertension. Talking with daughter she lives alone at home but has shown some cognitive decline over the last couple years but very mild. Patient still drives, takes care of her bills, cooks, in usually is able to do any activities of daily living. Over the last couple weeks the patient has been expressing to her daughter that she feels things crawling all over her. In the same sentence she will state that she is not crazy. In talking to the daughter there is no psych history in the family or extended family. Patient does have areas on her face in her scalp twitches noted that she might of been picking at herself. Over the last few days she's been noted to become very confused and thus was brought to [facility]. Patient was transferred to [facility].  Currently patient is alert but unable to give me any history. Majority of the history was obtained from the daughter. Patient clearly has difficulty with following 3 step commands and shows no concern for being confused. Patient cannot tell me why she was brought to [facility].  Past Medical History:  Diagnosis Date  . Essential hypertension   . Viral meningitis     Past Surgical History:  Procedure Laterality Date  . TONSILLECTOMY      Family History  Problem Relation Age of Onset  . Dementia Mother   . Heart attack Father       Social History:  reports that she has never smoked. She does not have any smokeless tobacco history on file. She reports that she does not drink alcohol or use drugs.  No Known Allergies  MEDICATIONS:  Prior to Admission:  Prescriptions Prior to Admission  Medication Sig Dispense Refill Last Dose  . ibuprofen (ADVIL,MOTRIN) 200 MG tablet Take 200-400 mg by mouth every 6 (six) hours as needed for headache or mild pain.   PRN   Scheduled: . aspirin  300 mg Rectal Daily   Or  . aspirin  325 mg Oral Daily  . enoxaparin (LOVENOX) injection  40 mg Subcutaneous Q24H     ROS:                                                                                                                                       History obtained from the patient  General ROS: negative for - chills, fatigue, fever, night sweats, weight gain or weight loss Psychological ROS: negative for - behavioral disorder, hallucinations, memory difficulties, mood swings or suicidal ideation Ophthalmic ROS: negative for - blurry vision, double vision, eye pain or loss of vision ENT ROS: negative for - epistaxis, nasal discharge, oral lesions, sore throat, tinnitus or vertigo Allergy and Immunology ROS: negative for - hives or itchy/watery eyes Hematological and Lymphatic ROS: negative for - bleeding problems, bruising or swollen lymph nodes Endocrine ROS: negative for - galactorrhea, hair pattern changes, polydipsia/polyuria or temperature intolerance Respiratory ROS: negative for - cough, hemoptysis, shortness of breath or wheezing Cardiovascular ROS: negative for - chest pain, dyspnea on exertion, edema or irregular heartbeat Gastrointestinal ROS: negative for - abdominal pain, diarrhea, hematemesis, nausea/vomiting or stool incontinence Genito-Urinary ROS: negative for - dysuria, hematuria, incontinence or urinary frequency/urgency Musculoskeletal ROS: negative for - joint swelling or muscular weakness Neurological ROS: as noted in HPI Dermatological ROS: negative for rash and skin lesion changes   Blood  pressure (!) 147/56, pulse 74, temperature 97.8 F (36.6 C), temperature source Oral, resp. rate 16, height 5\' 7"  (1.702 m), weight 94.1 kg (207 lb 6.4 oz), SpO2 95 %.   Neurologic Examination:                                                                                                      HEENT-  Normocephalic, no lesions, without obvious abnormality.  Normal external eye and conjunctiva.  Normal TM's bilaterally.  Normal auditory canals and external ears. Normal external nose, mucus membranes and septum.  Normal pharynx. Cardiovascular- S1, S2 normal, pulses palpable throughout   Lungs- chest clear, no wheezing, rales, normal symmetric air entry Abdomen- normal findings: bowel sounds normal Extremities- no edema Lymph-no adenopathy palpable Musculoskeletal-no joint tenderness, deformity  or swelling Skin-warm and dry, no hyperpigmentation, vitiligo, or suspicious lesions  Neurological Examination Mental Status: Alert, oriented to hospital, month, year however this takes significant time with thought processing. At times will talking to the daughter she is very fidgety and will laugh inappropriately.  Speech fluent without evidence of aphasia.  She is only able to follow 1-2 step commands when asked to three-step commands she becomes very confused. At times when asked to do two-step commands such as finger-nose she again becomes very confused and is not clear what I'm asking her to do even though I prompted her multiple times  Cranial Nerves: II: Due to inattention visual fields was somewhat difficult to ascertain, pupils equal, round, reactive to light and accommodation. When checking pupillary reflex it was noted that she does have cataracts. III,IV, VI: ptosis not present, extra-ocular motions intact bilaterally V,VII: smile symmetric, facial light touch sensation normal bilaterally VIII: hearing normal bilaterally IX,X: uvula rises symmetrically XI: bilateral shoulder shrug XII:  midline tongue extension Motor: Right : Upper extremity   5/5    Left:     Upper extremity   5/5  Lower extremity   5/5     Lower extremity   5/5 Tone and bulk:normal tone throughout; no atrophy noted Sensory: Pinprick and light touch intact throughout, bilaterally--without neglect Deep Tendon Reflexes: 2+ and symmetric throughout Plantars: Right: downgoing   Left: downgoing Cerebellar: normal finger-to-nose, Gait: Not tested      Lab Results: Basic Metabolic Panel: No results for input(s): NA, K, CL, CO2, GLUCOSE, BUN, CREATININE, CALCIUM, MG, PHOS in the last 168 hours.  Liver Function Tests: No results for input(s): AST, ALT, ALKPHOS, BILITOT, PROT, ALBUMIN in the last 168 hours.  Recent Labs Lab 08/23/16 2247  LIPASE 22   No results for input(s): AMMONIA in the last 168 hours.  CBC:  Recent Labs Lab 08/24/16 1134  WBC 11.5*  NEUTROABS 7.7  HGB 12.1  HCT 38.0  MCV 82.1  PLT 308    Cardiac Enzymes:  Recent Labs Lab 08/23/16 2247 08/24/16 0315  TROPONINI 0.03* 0.03*    Lipid Panel:  Recent Labs Lab 08/24/16 0205  CHOL 159  TRIG 47  HDL 65  CHOLHDL 2.4  VLDL 9  LDLCALC 85    CBG:  Recent Labs Lab 08/24/16 [id number]    Microbiology: No results found for this or any previous visit.  Coagulation Studies:  Recent Labs  08/23/16 2247  LABPROT 13.7  INR 1.05    Imaging: No results found.     Assessment and plan per attending neurologist  Etta Quill PA-C [facility] [phone]  08/24/2016, 12:21 PM   Assessment/Plan: This is an 82 year old female with fairly sudden onset of confusion of unclear etiology. Apparently she has had a workup at [facility] including MRI and lab values which were negative. At this time hospitalists are going to reorder all tests to confirm.  EEG has been obtained and showed no epileptiform activity. Exam is definitely notable for confusion, encephalopathy and possible right field  hemianopsia. She has elevated WBCs, she is dehydarated and was febrile suspect infectious etiology. If MRI is negative and EEG negative neurology will sign off.   Recommend: --MRI of brain with and without contrast in thin cuts --CMP, CBC, TSH, ammonia, B12, thiamine, RPR, folate --Will continue to follow   Personally examined patient and images, and have participated in and made any corrections needed to history, physical, neuro exam,assessment and plan as stated above.  I have personally obtained the history, evaluated lab date, reviewed imaging studies and agree with radiology interpretations. She has elevated WBCs, she is dehydarated and was febrile suspect infectious etiology. If MRI is negative and EEG negative neurology will sign off.     Sarina Ill, MD Stroke Neurology [phone] [facility]

## 2016-08-24 NOTE — Procedures (Signed)
ELECTROENCEPHALOGRAM REPORT  Date of Study: 08/24/2106  Patient's Name: Kristi Graham MRN: [id number] Date of Birth: [birth year]  Referring Provider: Ivor Costa, MD  Clinical History: 82 y.o. female with medical history significant of hypertension, viral meningitis, who presents with altered mental status, slurred speech and right arm weakness.  Medications:  0.9 % sodium chloride infusion   acetaminophen (TYLENOL) tablet 650 mg  L1 aspirin suppository 300 mg  L1 aspirin tablet 325 mg   enoxaparin (LOVENOX) injection 40 mg   hydrALAZINE (APRESOLINE) injection 5 mg   morphine 2 MG/ML injection 2 mg   nitroGLYCERIN (NITROSTAT) SL tablet 0.4 mg   ondansetron (ZOFRAN) injection 4 mg   senna-docusate (Senokot-S) tablet 1 tablet   zolpidem (AMBIEN) tablet 5 mg  Technical Summary: A multichannel digital EEG recording measured by the international 10-20 system with electrodes applied with paste and impedances below 5000 ohms performed in our laboratory with EKG monitoring in an awake and drowsy patient.  Hyperventilation and photic stimulation were not performed.  The digital EEG was referentially recorded, reformatted, and digitally filtered in a variety of bipolar and referential montages for optimal display.    Description: The patient is awake and drowsy during the recording.  During maximal wakefulness, there is a symmetric, medium voltage 8 Hz posterior dominant rhythm that attenuates with eye opening.  The record is symmetric.  During drowsiness and sleep, there is an increase in theta slowing of the background.  Vertex waves and symmetric sleep spindles were seen.  Hyperventilation and photic stimulation did not elicit any abnormalities.  There were no epileptiform discharges or electrographic seizures seen.    EKG lead was unremarkable.  Impression: This awake and drowsy EEG is normal.    Clinical Correlation: A normal EEG does not exclude a clinical diagnosis of epilepsy.  If  further clinical questions remain, prolonged EEG may be helpful.  Clinical correlation is advised.   Metta Clines, DO

## 2016-08-25 ENCOUNTER — Encounter (HOSPITAL_COMMUNITY): Payer: Self-pay

## 2016-08-25 ENCOUNTER — Inpatient Hospital Stay (HOSPITAL_COMMUNITY): Payer: Medicare Other

## 2016-08-25 DIAGNOSIS — I6789 Other cerebrovascular disease: Secondary | ICD-10-CM

## 2016-08-25 LAB — BASIC METABOLIC PANEL
ANION GAP: 6 (ref 5–15)
BUN: 24 mg/dL — ABNORMAL HIGH (ref 6–20)
CALCIUM: 9.1 mg/dL (ref 8.9–10.3)
CO2: 26 mmol/L (ref 22–32)
Chloride: 107 mmol/L (ref 101–111)
Creatinine, Ser: 1 mg/dL (ref 0.44–1.00)
GFR calc Af Amer: >60 mL/min (ref >60)
GFR, EST NON AFRICAN AMERICAN: 52 mL/min — AB (ref >60)
GLUCOSE: 128 mg/dL — AB (ref 65–99)
Potassium: 3.6 mmol/L (ref 3.5–5.1)
Sodium: 139 mmol/L (ref 135–145)

## 2016-08-25 LAB — ECHOCARDIOGRAM COMPLETE
Height: 67 in
Weight: 3318.4 oz

## 2016-08-25 LAB — RAPID URINE DRUG SCREEN, HOSP PERFORMED
Amphetamines: NOT DETECTED
Barbiturates: NOT DETECTED
Benzodiazepines: NOT DETECTED
Cocaine: NOT DETECTED
OPIATES: NOT DETECTED
TETRAHYDROCANNABINOL: NOT DETECTED

## 2016-08-25 LAB — GLUCOSE, CAPILLARY: Glucose-Capillary: 84 mg/dL (ref 65–99)

## 2016-08-25 LAB — RPR: RPR Ser Ql: NONREACTIVE

## 2016-08-25 LAB — HIV ANTIBODY (ROUTINE TESTING W REFLEX): HIV Screen 4th Generation wRfx: NONREACTIVE

## 2016-08-25 NOTE — Progress Notes (Signed)
  Echocardiogram 2D Echocardiogram has been performed.  Kristi Graham 08/25/2016, 12:48 PM

## 2016-08-25 NOTE — Evaluation (Signed)
Occupational Therapy Evaluation Patient Details Name: Kristi Graham MRN: [id number] DOB: 10-27-1933 Today's Date: 08/25/2016    History of Present Illness Patient is a 82 y/o female with hx of essential HTN and viral meningitis presents with AMS, slurred speech and right arm weakness. MRI-negative. Workup pending.   Clinical Impression   Per daughter, pt was independent in ADL, IADL and mobility prior to admission. Daughter called or visited daily. Pt presents with impaired cognition with MoCA score of 12/30 (26 being WNL) with deficits in visuospatial/executive functioning, long and short term memory, attention and abstract thinking. Pt with impulsivity and decreased awareness of deficits. Pt with risk of falls due to impulsivity. Pt will need close, 24 hour supervision upon discharge. If family cannot provide, need to consider placement. Will follow acutely.    Follow Up Recommendations  Supervision/Assistance - 24 hour;SNF    Equipment Recommendations       Recommendations for Other Services       Precautions / Restrictions Precautions Precautions: Fall Precaution Comments: impulsivity, decreased awareness of line Restrictions Weight Bearing Restrictions: No      Mobility Bed Mobility               General bed mobility comments: pt in chair  Transfers Overall transfer level: Needs assistance Equipment used: None Transfers: Sit to/from Stand Sit to Stand: Supervision         General transfer comment: Supervision for safety due to impulsivity, no physical assist needed.    Balance     Sitting balance-Leahy Scale: Good       Standing balance-Leahy Scale: Fair                              ADL Overall ADL's : Needs assistance/impaired Eating/Feeding: Independent;Sitting   Grooming: Wash/dry hands;Oral care;Standing;Minimal assistance (verbal cues for sequencing)   Upper Body Bathing: Supervision/ safety;Sitting   Lower Body  Bathing: Min guard;Sit to/from stand   Upper Body Dressing : Sitting;Minimal assistance (assist to orient gown)   Lower Body Dressing: Sit to/from stand;Supervision/safety   Toilet Transfer: Min guard;Ambulation   Toileting- Clothing Manipulation and Hygiene: Supervision/safety;Sit to/from stand       Functional mobility during ADLs: Min guard (moves quickly without awareness of IV line, poor safety)       Vision Additional Comments: appears intact, difficult to formally assess due to poor attention and direction following   Perception Perception Comments: decreased spatial awareness   Praxis      Pertinent Vitals/Pain Pain Assessment: No/denies pain     Hand Dominance Right   Extremity/Trunk Assessment Upper Extremity Assessment Upper Extremity Assessment: Overall WFL for tasks assessed   Lower Extremity Assessment Lower Extremity Assessment: Defer to PT evaluation       Communication Communication Communication: No difficulties   Cognition Arousal/Alertness: Awake/alert Behavior During Therapy: Impulsive Overall Cognitive Status: Impaired/Different from baseline Area of Impairment: Orientation;Safety/judgement;Problem solving;Memory;Attention;Following commands Orientation Level: Disoriented to;Time (date, knew month and year) Current Attention Level: Sustained (focused to sustained) Memory: Decreased short-term memory Following Commands: Follows one step commands with increased time (with repetition ) Safety/Judgement: Decreased awareness of safety;Decreased awareness of deficits Awareness: Intellectual Problem Solving: Slow processing;Difficulty sequencing;Requires verbal cues General Comments: Scored 11/30 on MoCA   General Comments       Exercises       Shoulder Instructions      Home Living Family/patient expects to be discharged to:: Private residence Living Arrangements: Alone  Available Help at Discharge: Family;Available PRN/intermittently  (daughter works nights, sister can supervise some time) Type of Home: House Home Access: Level entry     Home Layout: One level     Bathroom Shower/Tub: Teacher, early years/pre: Standard     Home Equipment: Shower seat;Hand held shower head          Prior Functioning/Environment Level of Independence: Independent        Comments: Per daughter, pt is completely independent. Drives, pays her bills.        OT Problem List: Impaired balance (sitting and/or standing);Decreased cognition;Decreased safety awareness   OT Treatment/Interventions: Self-care/ADL training;DME and/or AE instruction;Patient/family education;Therapeutic activities;Cognitive remediation/compensation;Balance training;Visual/perceptual remediation/compensation    OT Goals(Current goals can be found in the care plan section) Acute Rehab OT Goals Patient Stated Goal: to go home OT Goal Formulation: Patient unable to participate in goal setting Time For Goal Achievement: 09/08/16 Potential to Achieve Goals: Good ADL Goals Pt Will Perform Grooming: standing;with supervision (including identifying items necessary for 3 tasks) Pt Will Transfer to Toilet: with supervision;ambulating;regular height toilet Pt Will Perform Toileting - Clothing Manipulation and hygiene: with supervision;sit to/from stand Pt Will Perform Tub/Shower Transfer: with supervision;ambulating;shower seat;Tub transfer Additional ADL Goal #1: Pt will follow 2 step commands with 50% accuracy. Additional ADL Goal #2: Pt will demonstrate selective attention during ADL and mobility. Additional ADL Goal #3: Pt will gather ADL items from around the room with supervision.  OT Frequency: Min 2X/week   Barriers to D/C: Decreased caregiver support          Co-evaluation              End of Session Equipment Utilized During Treatment: Gait belt  Activity Tolerance: Patient tolerated treatment well Patient left: in chair;with  call bell/phone within reach;with chair alarm set;with family/visitor present   Time: [id number] OT Time Calculation (min): 36 min Charges:  OT General Charges $OT Visit: 1 Procedure OT Evaluation $OT Eval Moderate Complexity: 1 Procedure OT Treatments $Cognitive Skills Development: 8-22 mins G-Codes:    [name] 08/25/2016, 11:55 AM [phone]

## 2016-08-25 NOTE — Progress Notes (Signed)
PROGRESS NOTE    Kristi Graham  [id number] DOB: 1933/08/01 DOA: 08/23/2016 PCP: No primary care provider on file.    Brief Narrative:  Kristi Graham is a 82 y.o. female with medical history significant of hypertension, viral meningitis, who presents with altered mental status, slurred speech and right arm weakness.  Pt is transferred [facility].   Per pt's daugher, at baseline patient is able to perform all of her ADLs and is checked on from time to time by family. Pt was last know normal yesterday afternoon. She was found down surrounded with vomit by family at about 10:00 AM, for an unknown period of time.  Pt is confused. Pt was noted to have right arm and leg weakness and slurred speech. Per accepting Doctor, Dr. Baker Janus note, "in the ED patient was found to have 3 out of 5 strength in her right upper extremity and right lower extremity with 5 out of 5 strength on the contralateral side". She dose not seem to have vision loss or hearing loss. Per family, patient was not taking her blood pressure medications because it made her dizzy recently. 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 MRI of brain is negative. MRA of brain is stable since 2012 and negative.  Upon arrival patient is still confused, denies any pain anywhere, No chest pain or abdominal pain. She moves all extremities. No active coughing, nausea, vomiting or diarrhea noted.  Patient was found to have  WBC 14.0, electrolytes and renal function okay, negative chest x-ray, pending CT, temperature normal, slightly tachycardia. Pt is admitted to tele bed as inpt.  Repeat workup at [facility] hospital performed. MRI shows not acute abnormalities.  Laboratory data WNL.     Assessment & Plan:   Principal Problem:   Acute  encephalopathy Active Problems:   Essential hypertension   Receptive aphasia   Right arm weakness   Leukocytosis   Elevated troponin   Acute encephalopathy, right arm weakness and receptive aphasia: Etiology is not clear. MRI/MRA did not show acute new issues intracranially. Potential differential diagnosis includes TIA, seizure. Patient does not have fever, neck supple, less likely to have meningitis. - Admitted to telemetry bed as inpatient - repeat MRI negative - HgA1c 5.7 - FLP WNL - PT consult, OT consult - 2 d Echocardiogram pending - Carotid dopplers pending - EEG negative - Neurology consulted - vitamin B12 WNL - TSH and RPR negative  Essential hypertension: -hold amlodipine due to AMS -IV hydralazine when necessary  Leukocytosis: Etiology is not clear. CXR is negative, urinalysis negative. May be due to stress-induced demargination - WBC at 11.5 this morning  Elevated troponin: trop 0.03. Likely due to demand ischemia. No chest pain. 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 -ENT consulted- Dr. Melrose Nakayama to see patient - can be follow up outpatient   DVT ppx: SQ Heparin   Code Status: Full code Family Communication: Yes, patient's  Daughter, sister and granddaughter  at bed side Disposition Plan:  unclear at this time- pending therapy recommendations   Consultants:   Neurology  ENT  PT/OT  Procedures:   none  Antimicrobials:  none    Subjective: Patient seen and evaluated.  She says she feels significantly better today than she did yesterday.  States she was brought in for a possible stroke.  Unable to answer a few questions appropriately in relation to abstract thinking.  Eating well.   Neurology signed off.  Heavy metal panel pending. Patient daughter and granddaughter bedside. Agree with PT and OT evaluation to see if patient appropriate for SNF or ALF.  Objective: Vitals:   08/24/16 2234 08/25/16 0138 08/25/16 0546 08/25/16 1024  BP: (!) 120/43 (!) 130/50 117/72 (!) 140/50  Pulse: 80 70 67 95  Resp: 16 18 14 18   Temp: 98.9 F (37.2 C) 97.9 F (36.6 C) 98.7 F (37.1 C) 97.7 F (36.5 C)  TempSrc: Axillary Oral Axillary Oral  SpO2: 94% 95% 94% 95%  Weight:      Height:        Intake/Output Summary (Last 24 hours) at 08/25/16 1115 Last data filed at 08/25/16 0600  Gross per 24 hour  Intake          1866.25 ml  Output              600 ml  Net          1266.25 ml   Filed Weights   08/24/16 0000  Weight: 94.1 kg (207 lb 6.4 oz)    Examination:  General exam: Appears calm and comfortable  Respiratory system: Clear to auscultation. Respiratory effort normal. Cardiovascular system: S1 & S2 heard, RRR. No JVD, murmurs, rubs, gallops or clicks. No pedal edema. Gastrointestinal system: Abdomen is nondistended, soft and nontender. No organomegaly or masses felt. Normal bowel sounds heard. Central nervous system: Alert and oriented to person, place, time and situation.  Abstract thinking impaired could not explain "Can't judge a book by it's cover" and could not repeat "No ifs, ands or buts" Skin: No rashes, lesions or ulcers Psychiatry: limited insight. Mood & affect appropriate.     Data Reviewed: I have personally reviewed following labs and imaging studies  CBC:  Recent Labs Lab 08/24/16 1134  WBC 11.5*  NEUTROABS 7.7  HGB 12.1  HCT 38.0  MCV 82.1  PLT [id number]   Basic Metabolic Panel:  Recent Labs Lab 08/24/16 1134 08/25/16 0941  NA 136 139  K 3.4* 3.6  CL 104 107  CO2 23 26  GLUCOSE 116* 128*  BUN 29* 24*  CREATININE 1.11* 1.00  CALCIUM 9.0 9.1   GFR: Estimated Creatinine Clearance: 52.8 mL/min (by C-G formula based on SCr of 1  mg/dL). Liver Function Tests:  Recent Labs Lab 08/24/16 1134  AST 58*  ALT 24  ALKPHOS 54  BILITOT 0.8  PROT 7.2  ALBUMIN 3.7    Recent Labs Lab 08/23/16 2247  LIPASE 22    Recent Labs Lab 08/24/16 1134  AMMONIA 21   Coagulation Profile:  Recent Labs Lab 08/23/16 2247  INR 1.05   Cardiac Enzymes:  Recent Labs Lab 08/23/16 2247 08/24/16 0315 08/24/16 1134 08/24/16 1357  TROPONINI 0.03* 0.03* <0.03 <0.03   BNP (last 3 results) No results for input(s): PROBNP in the last 8760 hours. HbA1C:  Recent Labs  08/24/16 0205  HGBA1C 5.7*   CBG:  Recent Labs Lab 08/24/16 0753 08/25/16 0649  GLUCAP 96 84   Lipid Profile:  Recent Labs  08/24/16 0205  CHOL 159  HDL 65  LDLCALC 85  TRIG 47  CHOLHDL 2.4   Thyroid Function Tests:  Recent Labs  08/24/16  1134  TSH 1.031   Anemia Panel:  Recent Labs  08/24/16 1357  VITAMINB12 215   Sepsis Labs: No results for input(s): PROCALCITON, LATICACIDVEN in the last 168 hours.  No results found for this or any previous visit (from the past 240 hour(s)).       Radiology Studies: Mr Jeri Cos [id number] Contrast  Result Date: 08/24/2016 CLINICAL DATA:  Sudden onset confusion. Possible right-sided hemianopsia. EXAM: MRI HEAD WITHOUT AND WITH CONTRAST TECHNIQUE: Multiplanar, multiecho pulse sequences of the brain and surrounding structures were obtained without and with intravenous contrast. CONTRAST:  19 mL MultiHance COMPARISON:  Noncontrast brain MRI 08/23/2016 FINDINGS: Some sequences are mildly motion degraded. Brain: There is no evidence of acute infarct, intracranial hemorrhage, mass, midline shift, or extra-axial fluid collection. Mild generalized cerebral atrophy is within normal limits for age. Minimal cerebral white matter T2 signal changes are within normal limits for age. No abnormal enhancement is identified. Vascular: Major intracranial vascular flow voids are preserved. Skull and upper cervical  spine: Mild nonspecific diffuse bone marrow heterogeneity. No destructive osseous lesion identified. Sinuses/Orbits: Unremarkable orbits. Minimal right frontal and mild right ethmoid sinus mucosal thickening. 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 Other: None. IMPRESSION: Unremarkable appearance of the brain for age. Electronically Signed   By: Logan Bores M.D.   On: 08/24/2016 16:43        Scheduled Meds: . aspirin  300 mg Rectal Daily   Or  . aspirin  325 mg Oral Daily  . enoxaparin (LOVENOX) injection  40 mg Subcutaneous Q24H   Continuous Infusions: . sodium chloride 1,000 mL (08/23/16 2148)     LOS: 2 days    Time spent: 35 minutes    Newman Pies, MD [facility] Pager [phone]  If 7PM-7AM, please contact night-coverage [email or web address] Password [email or web address] 08/25/2016, 11:15 AM

## 2016-08-26 LAB — URINE CULTURE: Culture: 30000 — AB

## 2016-08-26 LAB — GLUCOSE, CAPILLARY: GLUCOSE-CAPILLARY: 95 mg/dL (ref 65–99)

## 2016-08-26 MED ORDER — AMPICILLIN 250 MG PO CAPS
250.0000 mg | ORAL_CAPSULE | Freq: Four times a day (QID) | ORAL | Status: DC
  Administered 2016-08-26 – 2016-08-27 (×3): 250 mg via ORAL
  Filled 2016-08-26 (×5): qty 1

## 2016-08-26 NOTE — Progress Notes (Signed)
Physical Therapy Treatment Patient Details Name: Kristi Graham MRN: [id number] DOB: 06-04-1933 Today's Date: 08/26/2016    History of Present Illness Patient is a 82 y/o female with hx of essential HTN and viral meningitis presents with AMS, slurred speech and right arm weakness. MRI-negative. Workup pending.    PT Comments    Patient making improvements with mobility and gait.  No loss of balance or staggering noted with gait or with high level balance activities.  Continues to demonstrate cognitive deficits (though improved), with decreased safety awareness, impulsiveness.  Patient states she will not go to a rehab facility.  Recommend patient have assist/supervision 24 hours for safety, and f/u HHPT.  Sister offered to stay with patient at d/c, however patient declined her offer.   Follow Up Recommendations  Home health PT;Supervision for mobility/OOB     Equipment Recommendations  None recommended by PT    Recommendations for Other Services       Precautions / Restrictions Precautions Precautions: Fall Precaution Comments: Impulsive, decreased safety awareness Restrictions Weight Bearing Restrictions: No    Mobility  Bed Mobility               General bed mobility comments: Patient in chair  Transfers Overall transfer level: Needs assistance Equipment used: None Transfers: Sit to/from Stand Sit to Stand: Supervision         General transfer comment: Supervision for safety due to impulsivity, no physical assist needed.  Ambulation/Gait Ambulation/Gait assistance: Supervision Ambulation Distance (Feet): 200 Feet Assistive device: None Gait Pattern/deviations: Step-through pattern;Drifts right/left Gait velocity: Increased/unsafe   General Gait Details: Cues to move at slower, more safe speed.  Balance improved, with no staggering/loss of balance noted.   Stairs            Wheelchair Mobility    Modified Rankin (Stroke Patients Only)        Balance           Standing balance support: No upper extremity supported;During functional activity Standing balance-Leahy Scale: Good               High level balance activites: Backward walking;Direction changes;Turns;Sudden stops;Head turns (walking around obstacles) High Level Balance Comments: No loss of balance while performing high level balance activities.  Difficulty following more complex commands for activities.    Cognition Arousal/Alertness: Awake/alert Behavior During Therapy: Impulsive Overall Cognitive Status: Impaired/Different from baseline Area of Impairment: Safety/judgement;Following commands;Memory;Problem solving     Memory: Decreased short-term memory Following Commands: Follows multi-step commands inconsistently;Follows multi-step commands with increased time Safety/Judgement: Decreased awareness of safety;Decreased awareness of deficits   Problem Solving: Slow processing;Difficulty sequencing;Requires verbal cues      Exercises      General Comments        Pertinent Vitals/Pain Pain Assessment: No/denies pain    Home Living                      Prior Function            PT Goals (current goals can now be found in the care plan section) Acute Rehab PT Goals Patient Stated Goal: to go home Progress towards PT goals: Progressing toward goals    Frequency    Min 3X/week      PT Plan Current plan remains appropriate    Co-evaluation             End of Session   Activity Tolerance: Patient tolerated treatment well Patient left: in chair;with call bell/phone  within reach;with chair alarm set;with family/visitor present     Time: [id number] PT Time Calculation (min) (ACUTE ONLY): 21 min  Charges:  $Gait Training: 8-22 mins                    G Codes:      Despina Pole 2016-09-17, 2:38 PM Carita Pian. Sanjuana Kava, [place] Pager [phone]

## 2016-08-26 NOTE — Progress Notes (Signed)
PROGRESS NOTE    Kristi Graham  [id number] DOB: 06-07-1933 DOA: 08/23/2016 PCP: No primary care provider on file.    Brief Narrative:  Kristi Graham is a 82 y.o. female with medical history significant of hypertension, viral meningitis, who presents with altered mental status, slurred speech and right arm weakness.  Pt is transferred [facility].   Per pt's daugher, at baseline patient is able to perform all of her ADLs and is checked on from time to time by family. Pt was last know normal yesterday afternoon. She was found down surrounded with vomit by family at about 10:00 AM, for an unknown period of time.  Pt is confused. Pt was noted to have right arm and leg weakness and slurred speech. Per accepting Doctor, Dr. Baker Janus note, "in the ED patient was found to have 3 out of 5 strength in her right upper extremity and right lower extremity with 5 out of 5 strength on the contralateral side". She dose not seem to have vision loss or hearing loss. Per family, patient was not taking her blood pressure medications because it made her dizzy recently. 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 MRI of brain is negative. MRA of brain is stable since 2012 and negative.  Upon arrival patient is still confused, denies any pain anywhere, No chest pain or abdominal pain. She moves all extremities. No active coughing, nausea, vomiting or diarrhea noted.  Patient was found to have  WBC 14.0, electrolytes and renal function okay, negative chest x-ray, pending CT, temperature normal, slightly tachycardia. Pt is admitted to tele bed as inpt.  Repeat workup at [facility] hospital performed. MRI shows not acute abnormalities.  Laboratory data WNL.     Assessment & Plan:   Principal Problem:   Acute  encephalopathy Active Problems:   Essential hypertension   Receptive aphasia   Right arm weakness   Leukocytosis   Elevated troponin   Acute encephalopathy, right arm weakness and receptive aphasia: Etiology is not clear. MRI/MRA did not show acute new issues intracranially. Potential differential diagnosis includes TIA, seizure. Patient does not have fever, neck supple, less likely to have meningitis. - Admitted to telemetry bed as inpatient - repeat MRI negative - HgA1c 5.7 - FLP WNL - PT consult, OT consult - 2 d Echocardiogram: The cavity size was normal. Wall thickness was normal. Systolic function was normal. The estimated ejection fraction was in the range of 50% to 55%. Wall motion was normal; there were no regional wall motion abnormalities. Doppler parameters are consistent with abnormal left ventricular relaxation (grade 1 diastolic dysfunction). Aortic valve: There was trivial regurgitation. Mitral valve: Moderately calcified annulus. - Carotid dopplers show 40-59% stenosis on left side - EEG negative - Neurology consulted and signed off - vitamin B12 WNL - TSH and RPR negative - Urine showed >30k colonies of Coag negative staph- will give Ampicillin 250mg  q6  Essential hypertension: -hold amlodipine due to AMS -IV hydralazine when necessary  Leukocytosis: Etiology is not clear. CXR is negative.  - WBC at 11.5 - ? Of UTI as culprit? - will treat with ampicillin  Elevated troponin: trop 0.03. Likely due to demand ischemia. No chest pain. 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 -ENT consulted- Dr. Melrose Nakayama consulted - can be follow up outpatient   DVT ppx: SQ Heparin   Code Status: Full code Family  Communication: No family bedside Disposition Plan: will begin UTI treatment and likely discharge in 24 hours   Consultants:   Neurology  ENT  PT/OT  Procedures:   none  Antimicrobials:   none    Subjective: Patient seen and evaluated.  Reports that she feels well and feels less confused.  Urine culture came back positive for 30k colonies of Staph.  Patient denies any dysuria but does have occasional suprapubic pain.  Denies chest pain, chest pressure, shortness of breath or increased work of breathing.  PT saw patient and encouraged home health PT.  Objective: Vitals:   08/25/16 1813 08/25/16 2022 08/26/16 0128 08/26/16 0455  BP: (!) 145/57 (!) 162/75 (!) 147/58 (!) 188/80  Pulse: 88 92 87 91  Resp: 19 18 18 18   Temp: 98.9 F (37.2 C) 98.4 F (36.9 C) 98.4 F (36.9 C) 97.7 F (36.5 C)  TempSrc: Oral Oral Oral Oral  SpO2: 98% 97% 96% 97%  Weight:      Height:        Intake/Output Summary (Last 24 hours) at 08/26/16 1644 Last data filed at 08/26/16 0453  Gross per 24 hour  Intake             2205 ml  Output                0 ml  Net             2205 ml   Filed Weights   08/24/16 0000  Weight: 94.1 kg (207 lb 6.4 oz)    Examination:  General exam: Appears calm and comfortable  Respiratory system: Clear to auscultation. Respiratory effort normal. Cardiovascular system: S1 & S2 heard, RRR. No JVD, murmurs, rubs, gallops or clicks. No pedal edema. Gastrointestinal system: Abdomen is nondistended, soft and nontender. No organomegaly or masses felt. Normal bowel sounds heard. Central nervous system: Alert and oriented to person, place, time and situation.  Abstract thinking impaired could not explain "Can't judge a book by it's cover" but patient seems more oriented Skin: No rashes, lesions or ulcers Psychiatry: limited insight. Mood & affect appropriate.     Data Reviewed: I have personally reviewed following labs and imaging studies  CBC:  Recent  Labs Lab 08/24/16 1134  WBC 11.5*  NEUTROABS 7.7  HGB 12.1  HCT 38.0  MCV 82.1  PLT A999333   Basic Metabolic Panel:  Recent Labs Lab 08/24/16 1134 08/25/16 0941  NA 136 139  K 3.4* 3.6  CL 104 107  CO2 23 26  GLUCOSE 116* 128*  BUN 29* 24*  CREATININE 1.11* 1.00  CALCIUM 9.0 9.1   GFR: Estimated Creatinine Clearance: 52.8 mL/min (by C-G formula based on SCr of 1 mg/dL). Liver Function Tests:  Recent Labs Lab 08/24/16 1134  AST 58*  ALT 24  ALKPHOS 54  BILITOT 0.8  PROT 7.2  ALBUMIN 3.7    Recent Labs Lab 08/23/16 2247  LIPASE 22    Recent Labs Lab 08/24/16 1134  AMMONIA 21   Coagulation Profile:  Recent Labs Lab 08/23/16 2247  INR 1.05   Cardiac Enzymes:  Recent Labs Lab 08/23/16 2247 08/24/16 0315 08/24/16 1134 08/24/16 1357  TROPONINI 0.03* 0.03* <0.03 <0.03   BNP (last 3 results)  No results for input(s): PROBNP in the last 8760 hours. HbA1C:  Recent Labs  08/24/16 0205  HGBA1C 5.7*   CBG:  Recent Labs Lab 08/24/16 0753 08/25/16 0649 08/26/16 0652  GLUCAP 96 84 95   Lipid Profile:  Recent Labs  08/24/16 0205  CHOL 159  HDL 65  LDLCALC 85  TRIG 47  CHOLHDL 2.4   Thyroid Function Tests:  Recent Labs  08/24/16 1134  TSH 1.031   Anemia Panel:  Recent Labs  08/24/16 1357  VITAMINB12 215   Sepsis Labs: No results for input(s): PROCALCITON, LATICACIDVEN in the last 168 hours.  Recent Results (from the past 240 hour(s))  Urine culture     Status: Abnormal   Collection Time: 08/23/16 10:55 PM  Result Value Ref Range Status   Specimen Description URINE, RANDOM  Final   Special Requests NONE  Final   Culture (A)  Final    30,000 COLONIES/mL STAPHYLOCOCCUS SPECIES (COAGULASE NEGATIVE)   Report Status 08/26/2016 FINAL  Final   Organism ID, Bacteria STAPHYLOCOCCUS SPECIES (COAGULASE NEGATIVE) (A)  Final      Susceptibility   Staphylococcus species (coagulase negative) - MIC*    CIPROFLOXACIN <=0.5 SENSITIVE  Sensitive     GENTAMICIN <=0.5 SENSITIVE Sensitive     NITROFURANTOIN <=16 SENSITIVE Sensitive     OXACILLIN <=0.25 SENSITIVE Sensitive     TETRACYCLINE 2 SENSITIVE Sensitive     VANCOMYCIN 2 SENSITIVE Sensitive     TRIMETH/SULFA <=10 SENSITIVE Sensitive     CLINDAMYCIN <=0.25 SENSITIVE Sensitive     RIFAMPIN <=0.5 SENSITIVE Sensitive     Inducible Clindamycin NEGATIVE Sensitive     * 30,000 COLONIES/mL STAPHYLOCOCCUS SPECIES (COAGULASE NEGATIVE)  Culture, blood (routine x 2)     Status: None (Preliminary result)   Collection Time: 08/24/16 11:40 AM  Result Value Ref Range Status   Specimen Description BLOOD RIGHT HAND  Final   Special Requests IN PEDIATRIC BOTTLE 2CC  Final   Culture NO GROWTH 2 DAYS  Final   Report Status PENDING  Incomplete  Culture, blood (routine x 2)     Status: None (Preliminary result)   Collection Time: 08/24/16 11:49 AM  Result Value Ref Range Status   Specimen Description BLOOD RIGHT HAND  Final   Special Requests IN PEDIATRIC BOTTLE 1CC  Final   Culture NO GROWTH 2 DAYS  Final   Report Status PENDING  Incomplete         Radiology Studies: No results found.      Scheduled Meds: . ampicillin  250 mg Oral Q6H  . aspirin  300 mg Rectal Daily   Or  . aspirin  325 mg Oral Daily  . enoxaparin (LOVENOX) injection  40 mg Subcutaneous Q24H   Continuous Infusions: . sodium chloride 75 mL/hr at 08/26/16 0453     LOS: 3 days    Time spent: 35 minutes    Newman Pies, MD [facility] Pager [phone]  If 7PM-7AM, please contact night-coverage [email or web address] Password [facility] 08/26/2016, 4:44 PM

## 2016-08-27 LAB — GLUCOSE, CAPILLARY: GLUCOSE-CAPILLARY: 88 mg/dL (ref 65–99)

## 2016-08-27 LAB — VAS US CAROTID
LCCADDIAS: -20 cm/s
LEFT ECA DIAS: -8 cm/s
LEFT VERTEBRAL DIAS: -18 cm/s
LICADSYS: -80 cm/s
Left CCA dist sys: -80 cm/s
Left CCA prox dias: -16 cm/s
Left CCA prox sys: -69 cm/s
Left ICA dist dias: -23 cm/s
Left ICA prox dias: -39 cm/s
Left ICA prox sys: -100 cm/s
RCCADSYS: -44 cm/s
RCCAPDIAS: -14 cm/s
RIGHT ECA DIAS: -8 cm/s
RIGHT VERTEBRAL DIAS: -22 cm/s
Right CCA prox sys: -70 cm/s

## 2016-08-27 MED ORDER — ATORVASTATIN CALCIUM 10 MG PO TABS: 20.0000 mg | ORAL_TABLET | Freq: Every day | ORAL | Status: DC

## 2016-08-27 MED ORDER — ASPIRIN 325 MG PO TABS: 325.0000 mg | ORAL_TABLET | Freq: Every day | ORAL | Status: DC

## 2016-08-27 MED ORDER — ATORVASTATIN CALCIUM 20 MG PO TABS: 10.0000 mg | ORAL_TABLET | Freq: Every day | ORAL | 0 refills | Status: DC

## 2016-08-27 MED ORDER — AMPICILLIN 250 MG PO CAPS: 250.0000 mg | ORAL_CAPSULE | Freq: Four times a day (QID) | ORAL | 0 refills | Status: AC

## 2016-08-27 NOTE — Progress Notes (Signed)
D/c to home now w/daughter Lattie Haw, via wheelchair. Understands d/c isntr.

## 2016-08-27 NOTE — Care Management Note (Addendum)
Case Management Note  Patient Details  Name: Kristi Graham MRN: [id number] Date of Birth: December 31, 1932  Subjective/Objective:                    Action/Plan: Patient discharging home with [facility] services. Pt is refusing the RN and aide.  CM met with the patient and her daughter and provided a list of [place] agencies in [place]. They selected [place]. Santiago Glad with [facility] notified and accepted the referral. Pts primary MD is Dr Nicoletta Ba. Bedside RN updated.  Expected Discharge Date:  08/26/16               Expected Discharge Plan:  [place]  In-House Referral:     Discharge planning Services  CM Consult  Post Acute Care Choice:  Home Health Choice offered to:  Patient  DME Arranged:    DME Agency:     HH Arranged:  RN, PT, OT, Nurse's Aide (Pt refusing RN, aide) [place] Agency:  [place]  Status of Service:  Completed, signed off  If discussed at [place] of Stay Meetings, dates discussed:    Additional Comments:  Pollie Friar, RN 08/27/2016, 10:37 AM

## 2016-08-27 NOTE — Discharge Summary (Signed)
Physician Discharge Summary  Kristi Graham [id number] DOB: [birth year] DOA: 08/23/2016  PCP: No primary care provider on file.  Admit date: 08/23/2016 Discharge date: 08/27/2016  Admitted From: Home Disposition:  Home  Recommendations for Outpatient Follow-up:  1. Follow up with PCP in 1-2 weeks 2. Discuss with PCP referral to vascular surgeon for carotid surgery 3. Set up appointment with Dr. Janace Hoard for hospital follow up within the next month 4. Home Health will get set up for you 5. Please finish antibiotic course as prescribed   Home Health: Yes Equipment/Devices: None  Discharge Condition: Stable CODE STATUS: Full Code Diet recommendation: Heart Healthy  Brief/Interim Summary: Kristi Graham a 82 y.o.femalewith medical history significant of hypertension, viral meningitis, who presents with altered mental status, slurred speech and right arm weakness.  Pt is transferred [facility].   Per pt's daugher, at baseline patient is able to perform all of her ADLs and is checked on from time to time by family. Pt was last know normal yesterday afternoon. She was found down surrounded with vomit by family at about 10:00 AM,for an unknown period of time. Pt is confused. Pt was noted to have right arm and leg weakness and slurred speech. Per accepting Doctor, Dr. Baker Janus note, "in the ED patient was found to have 3 out of 5 strength in her right upper extremity and right lower extremity with 5 out of 5 strength on the contralateral side". She dose not seem to have vision loss or hearing loss. Per family, patient was not taking her blood pressure medications because it made her dizzy recently. CT scan of head showed no evidence of ischemic stroke or hemorrhage,but did show an extensive mass with bony infiltration and destruction throughout the right maxillary and ethmoid sinuses consistent with possiblemucocele. MRI of brain is negative. MRA of brain is stable  since 2012 and negative.  Upon arrival patient is still confused, denies any pain anywhere, No chest pain orabdominal pain. She moves all extremities. No active coughing, nausea,vomiting or diarrhea noted.  Patient was found to have WBC 14.0, electrolytes and renal function okay, negative chest x-ray, pending CT, temperature normal, slightly tachycardia. Pt is admitted to tele bed as inpt.  Repeat workup at [facility] hospital performed. MRI shows not acute abnormalities.  Laboratory data WNL.  Urine culture showed 30k colonies of Staph and patient was started on Ampicillin as she voiced dysuria and in setting of confusion unclear whether or not this contributed to patient's original presentation. Patient improved and was able to ambulate and follow instructions- was discharged home with home health on 10/23.    Discharge Diagnoses:  Principal Problem:   Acute encephalopathy Active Problems:   Essential hypertension   Receptive aphasia   Right arm weakness   Leukocytosis   Elevated troponin    Discharge Instructions  Discharge Instructions    Call MD for:  difficulty breathing, headache or visual disturbances    Complete by:  As directed    Call MD for:  extreme fatigue    Complete by:  As directed    Call MD for:  persistant dizziness or light-headedness    Complete by:  As directed    Call MD for:  persistant nausea and vomiting    Complete by:  As directed    Call MD for:  severe uncontrolled pain    Complete by:  As directed    Call MD for:  temperature >100.4    Complete by:  As directed  Diet - low sodium heart healthy    Complete by:  As directed    Driving Restrictions    Complete by:  As directed    Please be cleared by your PCP to drive at next visit   Increase activity slowly    Complete by:  As directed    Increase activity slowly    Complete by:  As directed        Medication List    TAKE these medications   ampicillin 250 MG capsule Commonly known as:   PRINCIPEN Take 1 capsule (250 mg total) by mouth every 6 (six) hours.   ibuprofen 200 MG tablet Commonly known as:  ADVIL,MOTRIN Take 200-400 mg by mouth every 6 (six) hours as needed for headache or mild pain.      Follow-up Information    Melissa Montane, MD. Schedule an appointment as soon as possible for a visit in 4 week(s).   Specialty:  Otolaryngology Contact information: [address] [place] [address] [phone]        Neale Burly, MD. Schedule an appointment as soon as possible for a visit in 2 week(s).   Specialty:  Internal Medicine Contact information: [address] [place] [id number] [phone]          No Known Allergies  Consultations:  Neurology  PT/OT   Procedures/Studies: Mr Jeri Cos [id number] Contrast  Result Date: 08/24/2016 CLINICAL DATA:  Sudden onset confusion. Possible right-sided hemianopsia. EXAM: MRI HEAD WITHOUT AND WITH CONTRAST TECHNIQUE: Multiplanar, multiecho pulse sequences of the brain and surrounding structures were obtained without and with intravenous contrast. CONTRAST:  19 mL MultiHance COMPARISON:  Noncontrast brain MRI 08/23/2016 FINDINGS: Some sequences are mildly motion degraded. Brain: There is no evidence of acute infarct, intracranial hemorrhage, mass, midline shift, or extra-axial fluid collection. Mild generalized cerebral atrophy is within normal limits for age. Minimal cerebral white matter T2 signal changes are within normal limits for age. No abnormal enhancement is identified. Vascular: Major intracranial vascular flow voids are preserved. Skull and upper cervical spine: Mild nonspecific diffuse bone marrow heterogeneity. No destructive osseous lesion identified. Sinuses/Orbits: Unremarkable orbits. Minimal right frontal and mild right ethmoid sinus mucosal thickening. 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 Other: None. IMPRESSION: Unremarkable appearance of the brain for age. Electronically Signed   By: Logan Bores M.D.   On: 08/24/2016 16:43   Echocardiogram: Left ventricle: The cavity size was normal. Wall thickness was normal. Systolic function was normal. The estimated ejection fraction was in the range of 50% to 55%. Wall motion was normal; there were no regional wall motion abnormalities. Doppler parameters are consistent with abnormal left ventricular relaxation (grade 1 diastolic dysfunction). Aortic valve: There was trivial regurgitation. Mitral valve: Moderately calcified annulus   Subjective: Patient voices she is ready to go home today.  She mentions repeatedly she does not feel as though she needs help at home.  Per her daughter who is bedside she said she will agree to home health and then not let anyone in her house.  Patient encouraged to let home health in to her house to evaluate her safety and help with ambulation and safety measures.  Also encouraged to continue antibiotics at discharge.  Discussed with patient and daughter that patient will need to follow up with Dr. Janace Hoard for surgical evaluation and to discuss with PCP referral for vascular surgery for  stenosis of carotid artery.  Discharge Exam: Vitals:   08/27/16 0141 08/27/16 0550  BP: (!) 174/77 (!) 166/73  Pulse: 70   Resp: 18 18  Temp: 97.7 F (36.5 C) 97.9 F (36.6 C)   Vitals:   08/26/16 1808 08/26/16 2120 08/27/16 0141 08/27/16 0550  BP: (!) 167/69 (!) 175/76 (!) 174/77 (!) 166/73  Pulse: 92 83 70   Resp: 20 20 18 18   Temp: 98 F (36.7 C) 98 F (36.7 C) 97.7 F (36.5 C) 97.9 F (36.6 C)  TempSrc: Oral Oral Oral Oral  SpO2: 98% 97% 96% 95%  Weight:      Height:        General: Pt is alert, awake, not in acute distress Cardiovascular: RRR, S1/S2 +, no rubs, no gallops Respiratory: CTA bilaterally, no wheezing, no rhonchi Abdominal: Soft, NT, ND, bowel sounds + Extremities: no edema, no  cyanosis    The results of significant diagnostics from this hospitalization (including imaging, microbiology, ancillary and laboratory) are listed below for reference.     Microbiology: Recent Results (from the past 240 hour(s))  Urine culture     Status: Abnormal   Collection Time: 08/23/16 10:55 PM  Result Value Ref Range Status   Specimen Description URINE, RANDOM  Final   Special Requests NONE  Final   Culture (A)  Final    30,000 COLONIES/mL STAPHYLOCOCCUS SPECIES (COAGULASE NEGATIVE)   Report Status 08/26/2016 FINAL  Final   Organism ID, Bacteria STAPHYLOCOCCUS SPECIES (COAGULASE NEGATIVE) (A)  Final      Susceptibility   Staphylococcus species (coagulase negative) - MIC*    CIPROFLOXACIN <=0.5 SENSITIVE Sensitive     GENTAMICIN <=0.5 SENSITIVE Sensitive     NITROFURANTOIN <=16 SENSITIVE Sensitive     OXACILLIN <=0.25 SENSITIVE Sensitive     TETRACYCLINE 2 SENSITIVE Sensitive     VANCOMYCIN 2 SENSITIVE Sensitive     TRIMETH/SULFA <=10 SENSITIVE Sensitive     CLINDAMYCIN <=0.25 SENSITIVE Sensitive     RIFAMPIN <=0.5 SENSITIVE Sensitive     Inducible Clindamycin NEGATIVE Sensitive     * 30,000 COLONIES/mL STAPHYLOCOCCUS SPECIES (COAGULASE NEGATIVE)  Culture, blood (routine x 2)     Status: None (Preliminary result)   Collection Time: 08/24/16 11:40 AM  Result Value Ref Range Status   Specimen Description BLOOD RIGHT HAND  Final   Special Requests IN PEDIATRIC BOTTLE 2CC  Final   Culture NO GROWTH 2 DAYS  Final   Report Status PENDING  Incomplete  Culture, blood (routine x 2)     Status: None (Preliminary result)   Collection Time: 08/24/16 11:49 AM  Result Value Ref Range Status   Specimen Description BLOOD RIGHT HAND  Final   Special Requests IN PEDIATRIC BOTTLE 1CC  Final   Culture NO GROWTH 2 DAYS  Final   Report Status PENDING  Incomplete     Labs: BNP (last 3 results) No results for input(s): BNP in the last 8760 hours. Basic Metabolic Panel:  Recent  Labs Lab 08/24/16 1134 08/25/16 0941  NA 136 139  K 3.4* 3.6  CL 104 107  CO2 23 26  GLUCOSE 116* 128*  BUN 29* 24*  CREATININE 1.11* 1.00  CALCIUM 9.0 9.1   Liver Function Tests:  Recent Labs Lab 08/24/16 1134  AST 58*  ALT 24  ALKPHOS 54  BILITOT 0.8  PROT 7.2  ALBUMIN 3.7    Recent Labs Lab 08/23/16 2247  LIPASE 22    Recent Labs Lab 08/24/16 1134  AMMONIA 21   CBC:  Recent Labs Lab 08/24/16 1134  WBC 11.5*  NEUTROABS 7.7  HGB 12.1  HCT 38.0  MCV 82.1  PLT 308   Cardiac Enzymes:  Recent Labs Lab 08/23/16 2247 08/24/16 0315 08/24/16 1134 08/24/16 1357  TROPONINI 0.03* 0.03* <0.03 <0.03   BNP: Invalid input(s): POCBNP CBG:  Recent Labs Lab 08/24/16 0753 08/25/16 0649 08/26/16 0652 08/27/16 0612  GLUCAP 96 84 95 88   D-Dimer No results for input(s): DDIMER in the last 72 hours. Hgb A1c No results for input(s): HGBA1C in the last 72 hours. Lipid Profile No results for input(s): CHOL, HDL, LDLCALC, TRIG, CHOLHDL, LDLDIRECT in the last 72 hours. Thyroid function studies  Recent Labs  08/24/16 1134  TSH 1.031   Anemia work up  Recent Labs  08/24/16 1357  VITAMINB12 215   Urinalysis    Component Value Date/Time   COLORURINE AMBER (A) 08/23/2016 2255   APPEARANCEUR CLOUDY (A) 08/23/2016 2255   LABSPEC 1.023 08/23/2016 2255   PHURINE 6.0 08/23/2016 2255   GLUCOSEU NEGATIVE 08/23/2016 2255   HGBUR NEGATIVE 08/23/2016 2255   BILIRUBINUR SMALL (A) 08/23/2016 2255   KETONESUR 15 (A) 08/23/2016 2255   PROTEINUR 100 (A) 08/23/2016 2255   NITRITE NEGATIVE 08/23/2016 2255   LEUKOCYTESUR NEGATIVE 08/23/2016 2255   Sepsis Labs Invalid input(s): PROCALCITONIN,  WBC,  LACTICIDVEN Microbiology Recent Results (from the past 240 hour(s))  Urine culture     Status: Abnormal   Collection Time: 08/23/16 10:55 PM  Result Value Ref Range Status   Specimen Description URINE, RANDOM  Final   Special Requests NONE  Final   Culture  (A)  Final    30,000 COLONIES/mL STAPHYLOCOCCUS SPECIES (COAGULASE NEGATIVE)   Report Status 08/26/2016 FINAL  Final   Organism ID, Bacteria STAPHYLOCOCCUS SPECIES (COAGULASE NEGATIVE) (A)  Final      Susceptibility   Staphylococcus species (coagulase negative) - MIC*    CIPROFLOXACIN <=0.5 SENSITIVE Sensitive     GENTAMICIN <=0.5 SENSITIVE Sensitive     NITROFURANTOIN <=16 SENSITIVE Sensitive     OXACILLIN <=0.25 SENSITIVE Sensitive     TETRACYCLINE 2 SENSITIVE Sensitive     VANCOMYCIN 2 SENSITIVE Sensitive     TRIMETH/SULFA <=10 SENSITIVE Sensitive     CLINDAMYCIN <=0.25 SENSITIVE Sensitive     RIFAMPIN <=0.5 SENSITIVE Sensitive     Inducible Clindamycin NEGATIVE Sensitive     * 30,000 COLONIES/mL STAPHYLOCOCCUS SPECIES (COAGULASE NEGATIVE)  Culture, blood (routine x 2)     Status: None (Preliminary result)   Collection Time: 08/24/16 11:40 AM  Result Value Ref Range Status   Specimen Description BLOOD RIGHT HAND  Final   Special Requests IN PEDIATRIC BOTTLE 2CC  Final   Culture NO GROWTH 2 DAYS  Final   Report Status PENDING  Incomplete  Culture, blood (routine x 2)     Status: None (Preliminary result)   Collection Time: 08/24/16 11:49 AM  Result Value Ref Range Status   Specimen Description BLOOD RIGHT HAND  Final   Special Requests IN PEDIATRIC BOTTLE 1CC  Final   Culture NO GROWTH 2 DAYS  Final   Report Status PENDING  Incomplete     Time coordinating discharge: Over 30 minutes  SIGNED:   Newman Pies, MD  [facility] 08/27/2016, 9:09 AM Pager [phone] If 7PM-7AM, please contact night-coverage [email or web address] Password [email or web address]

## 2016-08-27 NOTE — Evaluation (Signed)
Speech Language Pathology Evaluation Patient Details Name: Kristi Graham MRN: [id number] DOB: November 30, 1932 Today's Date: 08/27/2016 Time: [id number] SLP Time Calculation (min) (ACUTE ONLY): 22 min  Problem List:  Patient Active Problem List   Diagnosis Date Noted  . Elevated troponin 08/24/2016  . Acute encephalopathy 08/23/2016  . 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 Right arm weakness 08/23/2016  . Leukocytosis 08/23/2016  . Viral meningitis   . Essential hypertension    Past Medical History:  Past Medical History:  Diagnosis Date  . Essential hypertension   . Viral meningitis    Past Surgical History:  Past Surgical History:  Procedure Laterality Date  . TONSILLECTOMY     HPI:  Elvena Jackovich a 82 y.o.femalewith medical history significant of hypertension, viral meningitis, who presents with altered mental status, slurred speech and right arm weakness.  Per pt's daugher, at baseline patient is able to perform all of her ADLs and is checked on from time to time by family. Pt was last know normal yesterday afternoon. She was found down surrounded with vomit by family at about 10:00 AM.  She was down for an unknown period of time. Pt is confused. Pt was noted to have right arm and leg weakness and slurred speech. Per accepting Doctor, Dr. Baker Janus note, "in the ED patient was found to have 3 out of 5 strength in her right upper extremity and right lower extremity with 5 out of 5 strength on the contralateral side". She does not seem to have vision loss or hearing loss. Per family, patient was not taking her blood pressure medications because it made her dizzy recently. CT scan of head showed no evidence of ischemic stroke or hemorrhage,but did show an extensive mass with bony infiltration and destruction throughout the right maxillary and ethmoid sinuses consistent with possiblemucocele. MRI of brain is negative. MRA of brain is stable since 2012 and negative.    Assessment /  Plan / Recommendation Clinical Impression  Cognitive/linguistic/motor speech evaluation was completed.  Oral mechanism exam did not reveal overt issues and the patient's speech was completely intelligible.  The patient did report that she felt it became a bit slurred when she is nervous.  This was not seen during exam.  The patient achieved a score of 24/30 on the [place] Exam indicating mild cognitive deficits.  Deficits primarily noted for delayed memory.  The patient reported that this is baseline and that she writes things down to facilitate recall at home.  She was able to immediately recall 3 novel words, attend to task, name objects and pictures, follow 3 step command, repeat a short phrase and solve simple problems.  Basic writing was functional and the patient was able to read and comprehend short sentences.  Acute ST needs are not identified.      SLP Assessment  Patient does not need any further Speech Lanaguage Pathology Services    Follow Up Recommendations  None          SLP Evaluation Cognition  Overall Cognitive Status: Within Functional Limits for tasks assessed Arousal/Alertness: Awake/alert Orientation Level: Oriented to person;Oriented to place;Oriented to situation (Pt knew month, year and day of the week but not the date.  ) Attention: Focused Focused Attention: Appears intact Memory: Impaired Memory Impairment: Decreased recall of new information;Decreased short term memory (3/3 immediate recall 3 novel words, 0/3 recall given a short delay 3 novel words.  Semantic cue facilitated recall.  ) Decreased Short Term Memory: Verbal basic Awareness: Appears intact  Problem Solving: Appears intact Safety/Judgment: Appears intact       Comprehension  Auditory Comprehension Overall Auditory Comprehension: Appears within functional limits for tasks assessed Yes/No Questions: Within Functional Limits Commands: Within Functional Limits Conversation: Simple Reading  Comprehension Reading Status: Within funtional limits    Expression Expression Primary Mode of Expression: Verbal Verbal Expression Overall Verbal Expression: Appears within functional limits for tasks assessed Initiation: No impairment Automatic Speech: Name Level of Generative/Spontaneous Verbalization: Conversation Repetition: No impairment Naming: No impairment Pragmatics: No impairment Written Expression Dominant Hand: Right Written Expression: Within Functional Limits (Pt was unable to copy a simple design.  )   Oral / Motor  Oral Motor/Sensory Function Overall Oral Motor/Sensory Function: Within functional limits Motor Speech Overall Motor Speech: Appears within functional limits for tasks assessed Phonation: Normal Resonance: Within functional limits Articulation: Within functional limitis Motor Planning: Witnin functional limits Motor Speech Errors: Not applicable   GO                   Shelly Flatten, MA, CCC-SLP Acute Rehab SLP [phone] Kristi Graham 08/27/2016, 9:32 AM

## 2016-08-27 NOTE — Progress Notes (Signed)
Physical Therapy Treatment Patient Details Name: Kristi Graham MRN: [id number] DOB: 04-18-1933 Today's Date: 08/27/2016    History of Present Illness Patient is a 82 y/o female with hx of essential HTN and viral meningitis presents with AMS, slurred speech and right arm weakness. MRI-negative. Workup pending.    PT Comments    Pt con't to progress functionally however remains to be impulsive, decreased safety awareness, decreased insight to deficits, and noted memory deficits as pt only able to recall 1/3 things from mini mental. Daughter aware pt shouldn't be home alone however pt extremely stubborn and is adamantly refusing help from family or friends. Due to cognitive impairments pt unsafe to return home alone. Pt also drives, and was unable to state appropriate driving etiquette as far as who should go at a 4 way stop or when to pass someone.   Follow Up Recommendations  Supervision/Assistance - 24 hour;Home health PT     Equipment Recommendations  None recommended by PT    Recommendations for Other Services       Precautions / Restrictions Precautions Precautions: Fall Precaution Comments: impulsive dec memory Restrictions Weight Bearing Restrictions: No    Mobility  Bed Mobility               General bed mobility comments: Patient in chair  Transfers Overall transfer level: Needs assistance Equipment used: None Transfers: Sit to/from Stand Sit to Stand: Supervision         General transfer comment: Supervision for safety due to impulsivity, no physical assist needed.  Ambulation/Gait Ambulation/Gait assistance: Supervision Ambulation Distance (Feet): 300 Feet Assistive device: None Gait Pattern/deviations: Step-through pattern;WFL(Within Functional Limits) Gait velocity: Increased/unsafe Gait velocity interpretation: at or above normal speed for age/gender General Gait Details: cues for optimal management of obstacles. pt almost running into bed  coming down the halland other patients walking in hall with PT   Stairs Stairs: Yes Stairs assistance: Min guard Stair Management: Two rails;Alternating pattern;Forwards Number of Stairs: 3 General stair comments: Cues for safety.   Wheelchair Mobility    Modified Rankin (Stroke Patients Only)       Balance                                    Cognition Arousal/Alertness: Awake/alert Behavior During Therapy: Impulsive Overall Cognitive Status: Impaired/Different from baseline Area of Impairment: Safety/judgement;Memory;Awareness     Memory: Decreased short-term memory (only able to recall 1/3 of mini mental exam,)   Safety/Judgement: Decreased awareness of safety;Decreased awareness of deficits Awareness: Emergent Problem Solving: Difficulty sequencing General Comments: pt remembered orange out of "orange, ball and red"    Exercises      General Comments General comments (skin integrity, edema, etc.): spoke extensively with pt and daughter regarding safety at home. daughter agrees she needs 24/7 supervision however pt adamant that "she is fine" and refusing assist except for home health. reports "i don't want anyone to stay with me and I won't have anyone do it" spoke about picking up through rugs, installing grab bars to improve safety at home      Pertinent Vitals/Pain Pain Assessment: No/denies pain    Home Living                      Prior Function            PT Goals (current goals can now be found in the care plan  section) Acute Rehab PT Goals Patient Stated Goal: go home alone Progress towards PT goals: Progressing toward goals    Frequency    Min 3X/week      PT Plan Current plan remains appropriate    Co-evaluation             End of Session Equipment Utilized During Treatment: Gait belt Activity Tolerance: Patient tolerated treatment well Patient left: in chair;with call bell/phone within reach;with chair alarm  set;with family/visitor present     Time: [id number] PT Time Calculation (min) (ACUTE ONLY): 24 min  Charges:  $Gait Training: 23-37 mins                    G Codes:      Kingsley Callander 08/27/2016, 9:09 AM   Kittie Plater, PT, DPT Pager #: [phone] Office #: [phone]

## 2016-08-28 LAB — HEAVY METALS, BLOOD
Arsenic: 8 ug/L (ref 2–23)
LEAD: 1 ug/dL (ref 0–19)
Mercury: NOT DETECTED ug/L (ref 0.0–14.9)

## 2016-08-28 LAB — HEAVY METALS PROFILE, URINE
ARSENIC (TOTAL),U: NOT DETECTED ug/L (ref 0–50)
Arsenic(Inorganic),U: NOT DETECTED ug/L (ref 0–19)
CREATININE(CRT), U: 0.86 g/L (ref 0.30–3.00)
LEAD RANDOM URINE: NOT DETECTED ug/L (ref 0–49)
MERCURY UR: NOT DETECTED ug/L (ref 0–19)

## 2016-08-29 LAB — CULTURE, BLOOD (ROUTINE X 2)
Culture: NO GROWTH
Culture: NO GROWTH

## 2016-09-21 DIAGNOSIS — G458 Other transient cerebral ischemic attacks and related syndromes: Secondary | ICD-10-CM | POA: Diagnosis not present

## 2016-09-21 DIAGNOSIS — I10 Essential (primary) hypertension: Secondary | ICD-10-CM | POA: Diagnosis not present

## 2016-09-21 DIAGNOSIS — N181 Chronic kidney disease, stage 1: Secondary | ICD-10-CM | POA: Diagnosis not present

## 2016-09-21 DIAGNOSIS — M545 Low back pain: Secondary | ICD-10-CM | POA: Diagnosis not present

## 2016-12-28 DIAGNOSIS — Z1389 Encounter for screening for other disorder: Secondary | ICD-10-CM | POA: Diagnosis not present

## 2016-12-28 DIAGNOSIS — N181 Chronic kidney disease, stage 1: Secondary | ICD-10-CM | POA: Diagnosis not present

## 2016-12-28 DIAGNOSIS — Z131 Encounter for screening for diabetes mellitus: Secondary | ICD-10-CM | POA: Diagnosis not present

## 2016-12-28 DIAGNOSIS — Z0000 Encounter for general adult medical examination without abnormal findings: Secondary | ICD-10-CM | POA: Diagnosis not present

## 2016-12-28 DIAGNOSIS — M545 Low back pain: Secondary | ICD-10-CM | POA: Diagnosis not present

## 2016-12-28 DIAGNOSIS — I10 Essential (primary) hypertension: Secondary | ICD-10-CM | POA: Diagnosis not present

## 2017-04-05 DIAGNOSIS — M545 Low back pain: Secondary | ICD-10-CM | POA: Diagnosis not present

## 2017-04-05 DIAGNOSIS — N181 Chronic kidney disease, stage 1: Secondary | ICD-10-CM | POA: Diagnosis not present

## 2017-04-05 DIAGNOSIS — I10 Essential (primary) hypertension: Secondary | ICD-10-CM | POA: Diagnosis not present

## 2017-07-26 DIAGNOSIS — N181 Chronic kidney disease, stage 1: Secondary | ICD-10-CM | POA: Diagnosis not present

## 2017-07-26 DIAGNOSIS — F411 Generalized anxiety disorder: Secondary | ICD-10-CM | POA: Diagnosis not present

## 2017-07-26 DIAGNOSIS — Z79899 Other long term (current) drug therapy: Secondary | ICD-10-CM | POA: Diagnosis not present

## 2017-07-26 DIAGNOSIS — I10 Essential (primary) hypertension: Secondary | ICD-10-CM | POA: Diagnosis not present

## 2017-08-21 DIAGNOSIS — Z23 Encounter for immunization: Secondary | ICD-10-CM | POA: Diagnosis not present

## 2017-11-13 DIAGNOSIS — N181 Chronic kidney disease, stage 1: Secondary | ICD-10-CM | POA: Diagnosis not present

## 2017-11-13 DIAGNOSIS — F411 Generalized anxiety disorder: Secondary | ICD-10-CM | POA: Diagnosis not present

## 2017-11-13 DIAGNOSIS — I10 Essential (primary) hypertension: Secondary | ICD-10-CM | POA: Diagnosis not present

## 2018-02-26 DIAGNOSIS — I10 Essential (primary) hypertension: Secondary | ICD-10-CM | POA: Diagnosis not present

## 2018-02-26 DIAGNOSIS — F411 Generalized anxiety disorder: Secondary | ICD-10-CM | POA: Diagnosis not present

## 2018-02-26 DIAGNOSIS — N181 Chronic kidney disease, stage 1: Secondary | ICD-10-CM | POA: Diagnosis not present

## 2018-06-18 DIAGNOSIS — N181 Chronic kidney disease, stage 1: Secondary | ICD-10-CM | POA: Diagnosis not present

## 2018-06-18 DIAGNOSIS — I10 Essential (primary) hypertension: Secondary | ICD-10-CM | POA: Diagnosis not present

## 2018-06-18 DIAGNOSIS — Z1389 Encounter for screening for other disorder: Secondary | ICD-10-CM | POA: Diagnosis not present

## 2018-06-18 DIAGNOSIS — F411 Generalized anxiety disorder: Secondary | ICD-10-CM | POA: Diagnosis not present

## 2018-06-18 DIAGNOSIS — Z0000 Encounter for general adult medical examination without abnormal findings: Secondary | ICD-10-CM | POA: Diagnosis not present

## 2018-10-22 DIAGNOSIS — I10 Essential (primary) hypertension: Secondary | ICD-10-CM | POA: Diagnosis not present

## 2018-10-22 DIAGNOSIS — F411 Generalized anxiety disorder: Secondary | ICD-10-CM | POA: Diagnosis not present

## 2018-10-22 DIAGNOSIS — N181 Chronic kidney disease, stage 1: Secondary | ICD-10-CM | POA: Diagnosis not present

## 2019-01-14 DIAGNOSIS — J0191 Acute recurrent sinusitis, unspecified: Secondary | ICD-10-CM | POA: Diagnosis not present

## 2019-01-14 DIAGNOSIS — F411 Generalized anxiety disorder: Secondary | ICD-10-CM | POA: Diagnosis not present

## 2019-01-14 DIAGNOSIS — N181 Chronic kidney disease, stage 1: Secondary | ICD-10-CM | POA: Diagnosis not present

## 2019-01-14 DIAGNOSIS — I10 Essential (primary) hypertension: Secondary | ICD-10-CM | POA: Diagnosis not present

## 2019-04-22 DIAGNOSIS — I10 Essential (primary) hypertension: Secondary | ICD-10-CM | POA: Diagnosis not present

## 2019-04-22 DIAGNOSIS — H938X9 Other specified disorders of ear, unspecified ear: Secondary | ICD-10-CM | POA: Diagnosis not present

## 2019-04-22 DIAGNOSIS — F411 Generalized anxiety disorder: Secondary | ICD-10-CM | POA: Diagnosis not present

## 2019-04-22 DIAGNOSIS — N181 Chronic kidney disease, stage 1: Secondary | ICD-10-CM | POA: Diagnosis not present

## 2019-06-03 ENCOUNTER — Other Ambulatory Visit: Payer: Self-pay

## 2019-08-12 DIAGNOSIS — I10 Essential (primary) hypertension: Secondary | ICD-10-CM | POA: Diagnosis not present

## 2019-08-12 DIAGNOSIS — F411 Generalized anxiety disorder: Secondary | ICD-10-CM | POA: Diagnosis not present

## 2019-08-12 DIAGNOSIS — Z0000 Encounter for general adult medical examination without abnormal findings: Secondary | ICD-10-CM | POA: Diagnosis not present

## 2019-08-12 DIAGNOSIS — Z1389 Encounter for screening for other disorder: Secondary | ICD-10-CM | POA: Diagnosis not present

## 2019-08-12 DIAGNOSIS — N181 Chronic kidney disease, stage 1: Secondary | ICD-10-CM | POA: Diagnosis not present

## 2019-08-12 DIAGNOSIS — H938X9 Other specified disorders of ear, unspecified ear: Secondary | ICD-10-CM | POA: Diagnosis not present

## 2019-12-16 DIAGNOSIS — E7849 Other hyperlipidemia: Secondary | ICD-10-CM | POA: Diagnosis not present

## 2019-12-16 DIAGNOSIS — I10 Essential (primary) hypertension: Secondary | ICD-10-CM | POA: Diagnosis not present

## 2019-12-16 DIAGNOSIS — F411 Generalized anxiety disorder: Secondary | ICD-10-CM | POA: Diagnosis not present

## 2019-12-16 DIAGNOSIS — N181 Chronic kidney disease, stage 1: Secondary | ICD-10-CM | POA: Diagnosis not present

## 2020-01-08 ENCOUNTER — Ambulatory Visit: Payer: Medicare Other | Attending: Internal Medicine

## 2020-01-08 DIAGNOSIS — Z23 Encounter for immunization: Secondary | ICD-10-CM | POA: Insufficient documentation

## 2020-01-08 NOTE — Progress Notes (Signed)
   Covid-19 Vaccination Clinic  Name:  Kristi Graham    MRN: [id number] DOB: 08-10-1933  01/08/2020  [name] was observed post Covid-19 immunization for 15 minutes without incident. She was provided with Vaccine Information Sheet and instruction to access the V-Safe system.   [name] was instructed to call 911 with any severe reactions post vaccine: Marland Kitchen Difficulty breathing  . Swelling of face and throat  . A fast heartbeat  . A bad rash all over body  . Dizziness and weakness   Immunizations Administered    Name Date Dose VIS Date Route   Moderna COVID-19 Vaccine 01/08/2020  9:58 AM 0.5 mL 10/06/2019 Intramuscular   Manufacturer: Moderna   [id number]   [id number]

## 2020-02-10 ENCOUNTER — Ambulatory Visit: Payer: Medicare Other | Attending: Internal Medicine

## 2020-02-10 DIAGNOSIS — Z23 Encounter for immunization: Secondary | ICD-10-CM

## 2020-02-10 NOTE — Progress Notes (Signed)
   Covid-19 Vaccination Clinic  Name:  Kristi Graham    MRN: [id number] DOB: 06/07/1933  02/10/2020  [name] was observed post Covid-19 immunization for 15 minutes without incident. She was provided with Vaccine Information Sheet and instruction to access the V-Safe system.   [name] was instructed to call 911 with any severe reactions post vaccine: Marland Kitchen Difficulty breathing  . Swelling of face and throat  . A fast heartbeat  . A bad rash all over body  . Dizziness and weakness   Immunizations Administered    Name Date Dose VIS Date Route   Moderna COVID-19 Vaccine 02/10/2020  9:34 AM 0.5 mL 10/06/2019 Intramuscular   Manufacturer: Levan Hurst   [id number]   [id number]

## 2020-04-20 DIAGNOSIS — I10 Essential (primary) hypertension: Secondary | ICD-10-CM | POA: Diagnosis not present

## 2020-04-20 DIAGNOSIS — F411 Generalized anxiety disorder: Secondary | ICD-10-CM | POA: Diagnosis not present

## 2020-04-20 DIAGNOSIS — N181 Chronic kidney disease, stage 1: Secondary | ICD-10-CM | POA: Diagnosis not present

## 2020-04-20 DIAGNOSIS — E7849 Other hyperlipidemia: Secondary | ICD-10-CM | POA: Diagnosis not present

## 2020-08-01 DIAGNOSIS — Z23 Encounter for immunization: Secondary | ICD-10-CM | POA: Diagnosis not present

## 2020-08-24 DIAGNOSIS — Z1331 Encounter for screening for depression: Secondary | ICD-10-CM | POA: Diagnosis not present

## 2020-08-24 DIAGNOSIS — I10 Essential (primary) hypertension: Secondary | ICD-10-CM | POA: Diagnosis not present

## 2020-08-24 DIAGNOSIS — E7849 Other hyperlipidemia: Secondary | ICD-10-CM | POA: Diagnosis not present

## 2020-08-24 DIAGNOSIS — F411 Generalized anxiety disorder: Secondary | ICD-10-CM | POA: Diagnosis not present

## 2020-08-24 DIAGNOSIS — N181 Chronic kidney disease, stage 1: Secondary | ICD-10-CM | POA: Diagnosis not present

## 2020-08-24 DIAGNOSIS — Z0000 Encounter for general adult medical examination without abnormal findings: Secondary | ICD-10-CM | POA: Diagnosis not present

## 2020-10-05 DIAGNOSIS — Z23 Encounter for immunization: Secondary | ICD-10-CM | POA: Diagnosis not present

## 2020-12-28 DIAGNOSIS — F411 Generalized anxiety disorder: Secondary | ICD-10-CM | POA: Diagnosis not present

## 2020-12-28 DIAGNOSIS — Z1331 Encounter for screening for depression: Secondary | ICD-10-CM | POA: Diagnosis not present

## 2020-12-28 DIAGNOSIS — E7849 Other hyperlipidemia: Secondary | ICD-10-CM | POA: Diagnosis not present

## 2020-12-28 DIAGNOSIS — Z0000 Encounter for general adult medical examination without abnormal findings: Secondary | ICD-10-CM | POA: Diagnosis not present

## 2020-12-28 DIAGNOSIS — N181 Chronic kidney disease, stage 1: Secondary | ICD-10-CM | POA: Diagnosis not present

## 2020-12-28 DIAGNOSIS — I10 Essential (primary) hypertension: Secondary | ICD-10-CM | POA: Diagnosis not present

## 2021-02-03 HISTORY — PX: HIP FRACTURE SURGERY: SHX118

## 2021-03-01 DIAGNOSIS — M25551 Pain in right hip: Secondary | ICD-10-CM | POA: Diagnosis not present

## 2021-03-01 DIAGNOSIS — Z20822 Contact with and (suspected) exposure to covid-19: Secondary | ICD-10-CM | POA: Diagnosis not present

## 2021-03-01 DIAGNOSIS — I10 Essential (primary) hypertension: Secondary | ICD-10-CM | POA: Diagnosis not present

## 2021-03-01 DIAGNOSIS — Z743 Need for continuous supervision: Secondary | ICD-10-CM | POA: Diagnosis not present

## 2021-03-01 DIAGNOSIS — D72829 Elevated white blood cell count, unspecified: Secondary | ICD-10-CM | POA: Diagnosis not present

## 2021-03-01 DIAGNOSIS — W010XXA Fall on same level from slipping, tripping and stumbling without subsequent striking against object, initial encounter: Secondary | ICD-10-CM | POA: Diagnosis not present

## 2021-03-01 DIAGNOSIS — Z79899 Other long term (current) drug therapy: Secondary | ICD-10-CM | POA: Diagnosis not present

## 2021-03-01 DIAGNOSIS — S72001A Fracture of unspecified part of neck of right femur, initial encounter for closed fracture: Secondary | ICD-10-CM | POA: Diagnosis not present

## 2021-03-01 DIAGNOSIS — W19XXXA Unspecified fall, initial encounter: Secondary | ICD-10-CM | POA: Diagnosis not present

## 2021-03-01 DIAGNOSIS — M79604 Pain in right leg: Secondary | ICD-10-CM | POA: Diagnosis not present

## 2021-03-01 DIAGNOSIS — Z8673 Personal history of transient ischemic attack (TIA), and cerebral infarction without residual deficits: Secondary | ICD-10-CM | POA: Diagnosis not present

## 2021-03-02 DIAGNOSIS — M1611 Unilateral primary osteoarthritis, right hip: Secondary | ICD-10-CM | POA: Diagnosis not present

## 2021-03-02 DIAGNOSIS — I739 Peripheral vascular disease, unspecified: Secondary | ICD-10-CM | POA: Diagnosis not present

## 2021-03-02 DIAGNOSIS — Z8673 Personal history of transient ischemic attack (TIA), and cerebral infarction without residual deficits: Secondary | ICD-10-CM | POA: Diagnosis not present

## 2021-03-02 DIAGNOSIS — D72829 Elevated white blood cell count, unspecified: Secondary | ICD-10-CM | POA: Diagnosis not present

## 2021-03-02 DIAGNOSIS — S72041A Displaced fracture of base of neck of right femur, initial encounter for closed fracture: Secondary | ICD-10-CM | POA: Diagnosis not present

## 2021-03-02 DIAGNOSIS — S72001A Fracture of unspecified part of neck of right femur, initial encounter for closed fracture: Secondary | ICD-10-CM | POA: Diagnosis not present

## 2021-03-02 DIAGNOSIS — I10 Essential (primary) hypertension: Secondary | ICD-10-CM | POA: Diagnosis not present

## 2021-03-02 DIAGNOSIS — W19XXXA Unspecified fall, initial encounter: Secondary | ICD-10-CM | POA: Diagnosis not present

## 2021-03-02 DIAGNOSIS — Z79899 Other long term (current) drug therapy: Secondary | ICD-10-CM | POA: Diagnosis not present

## 2021-03-03 DIAGNOSIS — S72041A Displaced fracture of base of neck of right femur, initial encounter for closed fracture: Secondary | ICD-10-CM | POA: Diagnosis not present

## 2021-03-03 DIAGNOSIS — Z471 Aftercare following joint replacement surgery: Secondary | ICD-10-CM | POA: Diagnosis not present

## 2021-03-03 DIAGNOSIS — S72001A Fracture of unspecified part of neck of right femur, initial encounter for closed fracture: Secondary | ICD-10-CM | POA: Diagnosis not present

## 2021-03-03 DIAGNOSIS — D72829 Elevated white blood cell count, unspecified: Secondary | ICD-10-CM | POA: Diagnosis not present

## 2021-03-03 DIAGNOSIS — I10 Essential (primary) hypertension: Secondary | ICD-10-CM | POA: Diagnosis not present

## 2021-03-03 DIAGNOSIS — S72051A Unspecified fracture of head of right femur, initial encounter for closed fracture: Secondary | ICD-10-CM | POA: Diagnosis not present

## 2021-03-03 DIAGNOSIS — Z96641 Presence of right artificial hip joint: Secondary | ICD-10-CM | POA: Diagnosis not present

## 2021-03-03 DIAGNOSIS — Z8673 Personal history of transient ischemic attack (TIA), and cerebral infarction without residual deficits: Secondary | ICD-10-CM | POA: Diagnosis not present

## 2021-03-04 DIAGNOSIS — N1831 Chronic kidney disease, stage 3a: Secondary | ICD-10-CM | POA: Diagnosis not present

## 2021-03-04 DIAGNOSIS — S72001D Fracture of unspecified part of neck of right femur, subsequent encounter for closed fracture with routine healing: Secondary | ICD-10-CM | POA: Diagnosis not present

## 2021-03-04 DIAGNOSIS — Z79899 Other long term (current) drug therapy: Secondary | ICD-10-CM | POA: Diagnosis not present

## 2021-03-04 DIAGNOSIS — D631 Anemia in chronic kidney disease: Secondary | ICD-10-CM | POA: Diagnosis not present

## 2021-03-04 DIAGNOSIS — Z9889 Other specified postprocedural states: Secondary | ICD-10-CM | POA: Diagnosis not present

## 2021-03-04 DIAGNOSIS — I129 Hypertensive chronic kidney disease with stage 1 through stage 4 chronic kidney disease, or unspecified chronic kidney disease: Secondary | ICD-10-CM | POA: Diagnosis not present

## 2021-03-04 DIAGNOSIS — Z8673 Personal history of transient ischemic attack (TIA), and cerebral infarction without residual deficits: Secondary | ICD-10-CM | POA: Diagnosis not present

## 2021-03-04 DIAGNOSIS — N390 Urinary tract infection, site not specified: Secondary | ICD-10-CM | POA: Diagnosis not present

## 2021-03-04 DIAGNOSIS — D72829 Elevated white blood cell count, unspecified: Secondary | ICD-10-CM | POA: Diagnosis not present

## 2021-03-05 DIAGNOSIS — Z8673 Personal history of transient ischemic attack (TIA), and cerebral infarction without residual deficits: Secondary | ICD-10-CM | POA: Diagnosis not present

## 2021-03-05 DIAGNOSIS — D72829 Elevated white blood cell count, unspecified: Secondary | ICD-10-CM | POA: Diagnosis not present

## 2021-03-05 DIAGNOSIS — R410 Disorientation, unspecified: Secondary | ICD-10-CM | POA: Diagnosis not present

## 2021-03-05 DIAGNOSIS — S72001D Fracture of unspecified part of neck of right femur, subsequent encounter for closed fracture with routine healing: Secondary | ICD-10-CM | POA: Diagnosis not present

## 2021-03-05 DIAGNOSIS — I10 Essential (primary) hypertension: Secondary | ICD-10-CM | POA: Diagnosis not present

## 2021-03-05 DIAGNOSIS — R42 Dizziness and giddiness: Secondary | ICD-10-CM | POA: Diagnosis not present

## 2021-03-06 DIAGNOSIS — M25551 Pain in right hip: Secondary | ICD-10-CM | POA: Diagnosis not present

## 2021-03-06 DIAGNOSIS — Z9181 History of falling: Secondary | ICD-10-CM | POA: Diagnosis not present

## 2021-03-06 DIAGNOSIS — R531 Weakness: Secondary | ICD-10-CM | POA: Diagnosis not present

## 2021-03-06 DIAGNOSIS — Z8673 Personal history of transient ischemic attack (TIA), and cerebral infarction without residual deficits: Secondary | ICD-10-CM | POA: Diagnosis not present

## 2021-03-06 DIAGNOSIS — H532 Diplopia: Secondary | ICD-10-CM | POA: Diagnosis not present

## 2021-03-06 DIAGNOSIS — R2689 Other abnormalities of gait and mobility: Secondary | ICD-10-CM | POA: Diagnosis not present

## 2021-03-06 DIAGNOSIS — Z7901 Long term (current) use of anticoagulants: Secondary | ICD-10-CM | POA: Diagnosis not present

## 2021-03-06 DIAGNOSIS — H8110 Benign paroxysmal vertigo, unspecified ear: Secondary | ICD-10-CM | POA: Diagnosis not present

## 2021-03-06 DIAGNOSIS — I10 Essential (primary) hypertension: Secondary | ICD-10-CM | POA: Diagnosis not present

## 2021-03-06 DIAGNOSIS — Z8679 Personal history of other diseases of the circulatory system: Secondary | ICD-10-CM | POA: Diagnosis not present

## 2021-03-06 DIAGNOSIS — Z20822 Contact with and (suspected) exposure to covid-19: Secondary | ICD-10-CM | POA: Diagnosis not present

## 2021-03-06 DIAGNOSIS — Z7982 Long term (current) use of aspirin: Secondary | ICD-10-CM | POA: Diagnosis not present

## 2021-03-06 DIAGNOSIS — S72001D Fracture of unspecified part of neck of right femur, subsequent encounter for closed fracture with routine healing: Secondary | ICD-10-CM | POA: Diagnosis not present

## 2021-03-06 DIAGNOSIS — E785 Hyperlipidemia, unspecified: Secondary | ICD-10-CM | POA: Diagnosis not present

## 2021-03-06 DIAGNOSIS — R42 Dizziness and giddiness: Secondary | ICD-10-CM | POA: Diagnosis not present

## 2021-03-06 DIAGNOSIS — K219 Gastro-esophageal reflux disease without esophagitis: Secondary | ICD-10-CM | POA: Diagnosis not present

## 2021-03-06 DIAGNOSIS — B009 Herpesviral infection, unspecified: Secondary | ICD-10-CM | POA: Diagnosis not present

## 2021-03-06 DIAGNOSIS — Z96641 Presence of right artificial hip joint: Secondary | ICD-10-CM | POA: Diagnosis not present

## 2021-03-06 DIAGNOSIS — W19XXXD Unspecified fall, subsequent encounter: Secondary | ICD-10-CM | POA: Diagnosis not present

## 2021-03-06 DIAGNOSIS — M6281 Muscle weakness (generalized): Secondary | ICD-10-CM | POA: Diagnosis not present

## 2021-03-06 DIAGNOSIS — E7849 Other hyperlipidemia: Secondary | ICD-10-CM | POA: Diagnosis not present

## 2021-03-08 DIAGNOSIS — R42 Dizziness and giddiness: Secondary | ICD-10-CM | POA: Diagnosis not present

## 2021-03-08 DIAGNOSIS — E7849 Other hyperlipidemia: Secondary | ICD-10-CM | POA: Diagnosis not present

## 2021-03-08 DIAGNOSIS — I10 Essential (primary) hypertension: Secondary | ICD-10-CM | POA: Diagnosis not present

## 2021-03-08 DIAGNOSIS — R531 Weakness: Secondary | ICD-10-CM | POA: Diagnosis not present

## 2021-03-08 DIAGNOSIS — Z96641 Presence of right artificial hip joint: Secondary | ICD-10-CM | POA: Diagnosis not present

## 2021-03-16 DIAGNOSIS — M25551 Pain in right hip: Secondary | ICD-10-CM | POA: Diagnosis not present

## 2021-03-29 DIAGNOSIS — S72001D Fracture of unspecified part of neck of right femur, subsequent encounter for closed fracture with routine healing: Secondary | ICD-10-CM | POA: Diagnosis not present

## 2021-03-29 DIAGNOSIS — Z7982 Long term (current) use of aspirin: Secondary | ICD-10-CM | POA: Diagnosis not present

## 2021-03-29 DIAGNOSIS — I10 Essential (primary) hypertension: Secondary | ICD-10-CM | POA: Diagnosis not present

## 2021-03-29 DIAGNOSIS — K219 Gastro-esophageal reflux disease without esophagitis: Secondary | ICD-10-CM | POA: Diagnosis not present

## 2021-03-29 DIAGNOSIS — Z96641 Presence of right artificial hip joint: Secondary | ICD-10-CM | POA: Diagnosis not present

## 2021-03-29 DIAGNOSIS — Z9181 History of falling: Secondary | ICD-10-CM | POA: Diagnosis not present

## 2021-03-29 DIAGNOSIS — W19XXXD Unspecified fall, subsequent encounter: Secondary | ICD-10-CM | POA: Diagnosis not present

## 2021-03-29 DIAGNOSIS — R42 Dizziness and giddiness: Secondary | ICD-10-CM | POA: Diagnosis not present

## 2021-03-29 DIAGNOSIS — Z7901 Long term (current) use of anticoagulants: Secondary | ICD-10-CM | POA: Diagnosis not present

## 2021-03-29 DIAGNOSIS — Z8673 Personal history of transient ischemic attack (TIA), and cerebral infarction without residual deficits: Secondary | ICD-10-CM | POA: Diagnosis not present

## 2021-03-31 DIAGNOSIS — K219 Gastro-esophageal reflux disease without esophagitis: Secondary | ICD-10-CM | POA: Diagnosis not present

## 2021-03-31 DIAGNOSIS — Z96641 Presence of right artificial hip joint: Secondary | ICD-10-CM | POA: Diagnosis not present

## 2021-03-31 DIAGNOSIS — R42 Dizziness and giddiness: Secondary | ICD-10-CM | POA: Diagnosis not present

## 2021-03-31 DIAGNOSIS — I10 Essential (primary) hypertension: Secondary | ICD-10-CM | POA: Diagnosis not present

## 2021-03-31 DIAGNOSIS — S72001D Fracture of unspecified part of neck of right femur, subsequent encounter for closed fracture with routine healing: Secondary | ICD-10-CM | POA: Diagnosis not present

## 2021-03-31 DIAGNOSIS — W19XXXD Unspecified fall, subsequent encounter: Secondary | ICD-10-CM | POA: Diagnosis not present

## 2021-04-04 DIAGNOSIS — Z96641 Presence of right artificial hip joint: Secondary | ICD-10-CM | POA: Diagnosis not present

## 2021-04-04 DIAGNOSIS — R42 Dizziness and giddiness: Secondary | ICD-10-CM | POA: Diagnosis not present

## 2021-04-04 DIAGNOSIS — W19XXXD Unspecified fall, subsequent encounter: Secondary | ICD-10-CM | POA: Diagnosis not present

## 2021-04-04 DIAGNOSIS — K219 Gastro-esophageal reflux disease without esophagitis: Secondary | ICD-10-CM | POA: Diagnosis not present

## 2021-04-04 DIAGNOSIS — I10 Essential (primary) hypertension: Secondary | ICD-10-CM | POA: Diagnosis not present

## 2021-04-04 DIAGNOSIS — S72001D Fracture of unspecified part of neck of right femur, subsequent encounter for closed fracture with routine healing: Secondary | ICD-10-CM | POA: Diagnosis not present

## 2021-04-06 DIAGNOSIS — I10 Essential (primary) hypertension: Secondary | ICD-10-CM | POA: Diagnosis not present

## 2021-04-06 DIAGNOSIS — Z96641 Presence of right artificial hip joint: Secondary | ICD-10-CM | POA: Diagnosis not present

## 2021-04-06 DIAGNOSIS — K219 Gastro-esophageal reflux disease without esophagitis: Secondary | ICD-10-CM | POA: Diagnosis not present

## 2021-04-06 DIAGNOSIS — S72001D Fracture of unspecified part of neck of right femur, subsequent encounter for closed fracture with routine healing: Secondary | ICD-10-CM | POA: Diagnosis not present

## 2021-04-06 DIAGNOSIS — W19XXXD Unspecified fall, subsequent encounter: Secondary | ICD-10-CM | POA: Diagnosis not present

## 2021-04-06 DIAGNOSIS — R42 Dizziness and giddiness: Secondary | ICD-10-CM | POA: Diagnosis not present

## 2021-04-10 DIAGNOSIS — K219 Gastro-esophageal reflux disease without esophagitis: Secondary | ICD-10-CM | POA: Diagnosis not present

## 2021-04-10 DIAGNOSIS — W19XXXD Unspecified fall, subsequent encounter: Secondary | ICD-10-CM | POA: Diagnosis not present

## 2021-04-10 DIAGNOSIS — S72001D Fracture of unspecified part of neck of right femur, subsequent encounter for closed fracture with routine healing: Secondary | ICD-10-CM | POA: Diagnosis not present

## 2021-04-10 DIAGNOSIS — Z96641 Presence of right artificial hip joint: Secondary | ICD-10-CM | POA: Diagnosis not present

## 2021-04-10 DIAGNOSIS — I10 Essential (primary) hypertension: Secondary | ICD-10-CM | POA: Diagnosis not present

## 2021-04-10 DIAGNOSIS — R42 Dizziness and giddiness: Secondary | ICD-10-CM | POA: Diagnosis not present

## 2021-04-12 DIAGNOSIS — N181 Chronic kidney disease, stage 1: Secondary | ICD-10-CM | POA: Diagnosis not present

## 2021-04-12 DIAGNOSIS — I10 Essential (primary) hypertension: Secondary | ICD-10-CM | POA: Diagnosis not present

## 2021-04-12 DIAGNOSIS — F411 Generalized anxiety disorder: Secondary | ICD-10-CM | POA: Diagnosis not present

## 2021-04-12 DIAGNOSIS — E7849 Other hyperlipidemia: Secondary | ICD-10-CM | POA: Diagnosis not present

## 2021-04-13 DIAGNOSIS — R42 Dizziness and giddiness: Secondary | ICD-10-CM | POA: Diagnosis not present

## 2021-04-13 DIAGNOSIS — I10 Essential (primary) hypertension: Secondary | ICD-10-CM | POA: Diagnosis not present

## 2021-04-13 DIAGNOSIS — S72001D Fracture of unspecified part of neck of right femur, subsequent encounter for closed fracture with routine healing: Secondary | ICD-10-CM | POA: Diagnosis not present

## 2021-04-13 DIAGNOSIS — K219 Gastro-esophageal reflux disease without esophagitis: Secondary | ICD-10-CM | POA: Diagnosis not present

## 2021-04-13 DIAGNOSIS — M25551 Pain in right hip: Secondary | ICD-10-CM | POA: Diagnosis not present

## 2021-04-13 DIAGNOSIS — W19XXXD Unspecified fall, subsequent encounter: Secondary | ICD-10-CM | POA: Diagnosis not present

## 2021-04-13 DIAGNOSIS — Z96641 Presence of right artificial hip joint: Secondary | ICD-10-CM | POA: Diagnosis not present

## 2021-04-18 DIAGNOSIS — I10 Essential (primary) hypertension: Secondary | ICD-10-CM | POA: Diagnosis not present

## 2021-04-18 DIAGNOSIS — K219 Gastro-esophageal reflux disease without esophagitis: Secondary | ICD-10-CM | POA: Diagnosis not present

## 2021-04-18 DIAGNOSIS — Z96641 Presence of right artificial hip joint: Secondary | ICD-10-CM | POA: Diagnosis not present

## 2021-04-18 DIAGNOSIS — W19XXXD Unspecified fall, subsequent encounter: Secondary | ICD-10-CM | POA: Diagnosis not present

## 2021-04-18 DIAGNOSIS — R42 Dizziness and giddiness: Secondary | ICD-10-CM | POA: Diagnosis not present

## 2021-04-18 DIAGNOSIS — S72001D Fracture of unspecified part of neck of right femur, subsequent encounter for closed fracture with routine healing: Secondary | ICD-10-CM | POA: Diagnosis not present

## 2021-04-20 DIAGNOSIS — K219 Gastro-esophageal reflux disease without esophagitis: Secondary | ICD-10-CM | POA: Diagnosis not present

## 2021-04-20 DIAGNOSIS — R42 Dizziness and giddiness: Secondary | ICD-10-CM | POA: Diagnosis not present

## 2021-04-20 DIAGNOSIS — I10 Essential (primary) hypertension: Secondary | ICD-10-CM | POA: Diagnosis not present

## 2021-04-20 DIAGNOSIS — S72001D Fracture of unspecified part of neck of right femur, subsequent encounter for closed fracture with routine healing: Secondary | ICD-10-CM | POA: Diagnosis not present

## 2021-04-20 DIAGNOSIS — W19XXXD Unspecified fall, subsequent encounter: Secondary | ICD-10-CM | POA: Diagnosis not present

## 2021-04-20 DIAGNOSIS — Z96641 Presence of right artificial hip joint: Secondary | ICD-10-CM | POA: Diagnosis not present

## 2021-05-25 DIAGNOSIS — M25551 Pain in right hip: Secondary | ICD-10-CM | POA: Diagnosis not present

## 2021-05-25 DIAGNOSIS — Z23 Encounter for immunization: Secondary | ICD-10-CM | POA: Diagnosis not present

## 2021-08-16 DIAGNOSIS — F411 Generalized anxiety disorder: Secondary | ICD-10-CM | POA: Diagnosis not present

## 2021-08-16 DIAGNOSIS — E7849 Other hyperlipidemia: Secondary | ICD-10-CM | POA: Diagnosis not present

## 2021-08-16 DIAGNOSIS — I10 Essential (primary) hypertension: Secondary | ICD-10-CM | POA: Diagnosis not present

## 2021-08-16 DIAGNOSIS — N181 Chronic kidney disease, stage 1: Secondary | ICD-10-CM | POA: Diagnosis not present

## 2021-08-31 DIAGNOSIS — M25551 Pain in right hip: Secondary | ICD-10-CM | POA: Diagnosis not present

## 2021-09-12 DIAGNOSIS — Z23 Encounter for immunization: Secondary | ICD-10-CM | POA: Diagnosis not present

## 2021-12-20 DIAGNOSIS — R922 Inconclusive mammogram: Secondary | ICD-10-CM | POA: Diagnosis not present

## 2021-12-20 DIAGNOSIS — N6315 Unspecified lump in the right breast, overlapping quadrants: Secondary | ICD-10-CM | POA: Diagnosis not present

## 2021-12-21 DIAGNOSIS — Z1331 Encounter for screening for depression: Secondary | ICD-10-CM | POA: Diagnosis not present

## 2021-12-21 DIAGNOSIS — Z0000 Encounter for general adult medical examination without abnormal findings: Secondary | ICD-10-CM | POA: Diagnosis not present

## 2021-12-21 DIAGNOSIS — I10 Essential (primary) hypertension: Secondary | ICD-10-CM | POA: Diagnosis not present

## 2021-12-21 DIAGNOSIS — N181 Chronic kidney disease, stage 1: Secondary | ICD-10-CM | POA: Diagnosis not present

## 2021-12-21 DIAGNOSIS — F411 Generalized anxiety disorder: Secondary | ICD-10-CM | POA: Diagnosis not present

## 2021-12-21 DIAGNOSIS — E7849 Other hyperlipidemia: Secondary | ICD-10-CM | POA: Diagnosis not present

## 2021-12-22 DIAGNOSIS — C50211 Malignant neoplasm of upper-inner quadrant of right female breast: Secondary | ICD-10-CM | POA: Diagnosis not present

## 2021-12-22 DIAGNOSIS — C50811 Malignant neoplasm of overlapping sites of right female breast: Secondary | ICD-10-CM | POA: Diagnosis not present

## 2021-12-22 DIAGNOSIS — Z170 Estrogen receptor positive status [ER+]: Secondary | ICD-10-CM | POA: Diagnosis not present

## 2021-12-22 DIAGNOSIS — N6312 Unspecified lump in the right breast, upper inner quadrant: Secondary | ICD-10-CM | POA: Diagnosis not present

## 2021-12-26 ENCOUNTER — Other Ambulatory Visit: Payer: Self-pay | Admitting: *Deleted

## 2021-12-26 DIAGNOSIS — C50911 Malignant neoplasm of unspecified site of right female breast: Secondary | ICD-10-CM

## 2022-01-02 ENCOUNTER — Ambulatory Visit (INDEPENDENT_AMBULATORY_CARE_PROVIDER_SITE_OTHER): Payer: Medicare Other | Admitting: General Surgery

## 2022-01-02 ENCOUNTER — Other Ambulatory Visit: Payer: Self-pay

## 2022-01-02 ENCOUNTER — Encounter: Payer: Self-pay | Admitting: General Surgery

## 2022-01-02 VITALS — BP 148/73 | HR 82 | Temp 98.3°F | Resp 12 | Ht 67.0 in | Wt 179.0 lb

## 2022-01-02 DIAGNOSIS — C50311 Malignant neoplasm of lower-inner quadrant of right female breast: Secondary | ICD-10-CM

## 2022-01-02 NOTE — Progress Notes (Signed)
Kristi Graham; [id number]; Feb 22, 1933   HPI Patient is an 88 year old white female who was referred to my care by Dr. Sherrie Sport for evaluation and treatment of newly diagnosed invasive ductal carcinoma of the right breast.  Patient denies any family history of breast cancer.  She did feel a lump on self-examination recently and was found to be biopsy-proven invasive ductal carcinoma.  Reportedly she is ER positive.  Further testing is pending.  She denies any nipple discharge.  She is present with her daughter who is a Marine scientist. Past Medical History:  Diagnosis Date   Essential hypertension    Viral meningitis     Past Surgical History:  Procedure Laterality Date   TONSILLECTOMY      Family History  Problem Relation Age of Onset   Dementia Mother    Heart attack Father     Current Outpatient Medications on File Prior to Visit  Medication Sig Dispense Refill   amLODipine (NORVASC) 5 MG tablet Take by mouth.     aspirin 81 MG EC tablet Take by mouth.     atorvastatin (LIPITOR) 20 MG tablet Take 0.5 tablets (10 mg total) by mouth daily at 6 PM. 30 tablet 0   No current facility-administered medications on file prior to visit.    No Known Allergies  Social History   Substance and Sexual Activity  Alcohol Use No    Social History   Tobacco Use  Smoking Status Former   Types: Cigarettes   Passive exposure: Past  Smokeless Tobacco Never    Review of Systems  Constitutional: Negative.   HENT:  Positive for sinus pain.   Eyes: Negative.   Respiratory: Negative.    Cardiovascular: Negative.   Gastrointestinal: Negative.   Genitourinary: Negative.   Musculoskeletal: Negative.   Skin: Negative.   Neurological: Negative.   Endo/Heme/Allergies: Negative.   Psychiatric/Behavioral: Negative.     Objective   Vitals:   01/02/22 0855  BP: (!) 148/73  Pulse: 82  Resp: 12  Temp: 98.3 F (36.8 C)  SpO2: 95%    Physical Exam Vitals reviewed.  Constitutional:       Appearance: Normal appearance. She is normal weight. She is not ill-appearing.  HENT:     Head: Normocephalic and atraumatic.  Cardiovascular:     Rate and Rhythm: Normal rate and regular rhythm.     Heart sounds: Normal heart sounds. No murmur heard.   No friction rub. No gallop.  Pulmonary:     Effort: Pulmonary effort is normal. No respiratory distress.     Breath sounds: Normal breath sounds. No stridor. No wheezing, rhonchi or rales.  Musculoskeletal:     Cervical back: Normal range of motion.  Lymphadenopathy:     Cervical: No cervical adenopathy.  Skin:    General: Skin is warm and dry.  Neurological:     Mental Status: She is alert and oriented to person, place, and time.  Breast: Dominant 2-1/2 cm mass with bruising present in the lower, inner quadrant of the right breast.  No nipple discharge or dimpling is noted.  Axilla is negative for palpable nodes.  Left breast examination reveals no dominant mass, nipple discharge, or dimpling.  The axilla is negative for palpable nodes.  Primary care and radiology notes reviewed  Assessment  Invasive ductal carcinoma of the right breast.  No clinically involved axillary nodes, ER positive, further testing pending Plan  Given the patient's age, I recommend either a simple mastectomy or partial mastectomy with postoperative radiation  therapy.  Patient does live alone and her daughter drives her to appointments.  The risks and benefits of both procedures were fully explained to the patient.  They will follow-up here in several weeks with their decision.

## 2022-01-18 ENCOUNTER — Encounter: Payer: Self-pay | Admitting: General Surgery

## 2022-01-18 ENCOUNTER — Ambulatory Visit (INDEPENDENT_AMBULATORY_CARE_PROVIDER_SITE_OTHER): Payer: Medicare Other | Admitting: General Surgery

## 2022-01-18 ENCOUNTER — Other Ambulatory Visit: Payer: Self-pay

## 2022-01-18 VITALS — BP 155/73 | HR 79 | Temp 97.0°F | Resp 16 | Ht 67.0 in | Wt 179.0 lb

## 2022-01-18 DIAGNOSIS — Z170 Estrogen receptor positive status [ER+]: Secondary | ICD-10-CM | POA: Diagnosis not present

## 2022-01-18 DIAGNOSIS — C50311 Malignant neoplasm of lower-inner quadrant of right female breast: Secondary | ICD-10-CM | POA: Diagnosis not present

## 2022-01-18 NOTE — H&P (Signed)
Kristi Graham; [id number]; [birth year] ? ? ?HPI ?Patient is an 88 year old white female who was referred to my care by Dr. Sherrie Sport for evaluation and treatment of newly diagnosed invasive ductal carcinoma of the right breast.  Patient denies any family history of breast cancer.  She did feel a lump on self-examination recently and was found to be biopsy-proven invasive ductal carcinoma.  Reportedly she is ER positive.  Further testing is pending.  She denies any nipple discharge.  She is present with her daughter who is a Marine scientist. ?Past Medical History:  ?Diagnosis Date  ? Essential hypertension   ? Viral meningitis   ? ? ?Past Surgical History:  ?Procedure Laterality Date  ? TONSILLECTOMY    ? ? ?Family History  ?Problem Relation Age of Onset  ? Dementia Mother   ? Heart attack Father   ? ? ?Current Outpatient Medications on File Prior to Visit  ?Medication Sig Dispense Refill  ? amLODipine (NORVASC) 5 MG tablet Take by mouth.    ? aspirin 81 MG EC tablet Take by mouth.    ? atorvastatin (LIPITOR) 20 MG tablet Take 0.5 tablets (10 mg total) by mouth daily at 6 PM. 30 tablet 0  ? ?No current facility-administered medications on file prior to visit.  ? ? ?No Known Allergies ? ?Social History  ? ?Substance and Sexual Activity  ?Alcohol Use No  ? ? ?Social History  ? ?Tobacco Use  ?Smoking Status Former  ? Types: Cigarettes  ? Passive exposure: Past  ?Smokeless Tobacco Never  ? ? ?Review of Systems  ?Constitutional: Negative.   ?HENT:  Positive for sinus pain.   ?Eyes: Negative.   ?Respiratory: Negative.    ?Cardiovascular: Negative.   ?Gastrointestinal: Negative.   ?Genitourinary: Negative.   ?Musculoskeletal: Negative.   ?Skin: Negative.   ?Neurological: Negative.   ?Endo/Heme/Allergies: Negative.   ?Psychiatric/Behavioral: Negative.    ? ?Objective  ? ?Vitals:  ? 01/02/22 0855  ?BP: (!) 148/73  ?Pulse: 82  ?Resp: 12  ?Temp: 98.3 ?F (36.8 ?C)  ?SpO2: 95%  ? ? ?Physical Exam ?Vitals reviewed.  ?Constitutional:   ?    Appearance: Normal appearance. She is normal weight. She is not ill-appearing.  ?HENT:  ?   Head: Normocephalic and atraumatic.  ?Cardiovascular:  ?   Rate and Rhythm: Normal rate and regular rhythm.  ?   Heart sounds: Normal heart sounds. No murmur heard. ?  No friction rub. No gallop.  ?Pulmonary:  ?   Effort: Pulmonary effort is normal. No respiratory distress.  ?   Breath sounds: Normal breath sounds. No stridor. No wheezing, rhonchi or rales.  ?Musculoskeletal:  ?   Cervical back: Normal range of motion.  ?Lymphadenopathy:  ?   Cervical: No cervical adenopathy.  ?Skin: ?   General: Skin is warm and dry.  ?Neurological:  ?   Mental Status: She is alert and oriented to person, place, and time.  ?Breast: Dominant 2-1/2 cm mass with bruising present in the lower, inner quadrant of the right breast.  No nipple discharge or dimpling is noted.  Axilla is negative for palpable nodes.  Left breast examination reveals no dominant mass, nipple discharge, or dimpling.  The axilla is negative for palpable nodes. ? ?Primary care and radiology notes reviewed ? ?Assessment  ?Invasive ductal carcinoma of the right breast.  No clinically involved axillary nodes, ER positive, further testing pending ?Plan  ?Patient scheduled for right partial mastectomy on 01/29/22.  The risks and benefits of the procedure  including bleeding, infection, and unclear margins were fully explained to the patient, who gives informed consent. ?

## 2022-01-18 NOTE — Progress Notes (Signed)
Subjective:  ?  ? Kern Reap  ?Patient returns to discuss surgical options.  Her daughter is present with her.  I did discuss her case with Dr. Delton Coombes who states she can undergo a partial mastectomy without postoperative radiation therapy due to her age.  She is ER +. ?Objective:  ? ? BP (!) 155/73   Pulse 79   Temp (!) 97 ?F (36.1 ?C) (Other (Comment))   Resp 16   Ht '5\' 7"'$  (1.702 m)   Wt 179 lb (81.2 kg)   SpO2 95%   BMI 28.04 kg/m?  ? ?General:  alert, cooperative, and no distress  ? ?   ? ?Assessment:  ? ? Right breast cancer  ?  ?Plan:  ?Scheduled for right partial mastectomy on 01/29/22.  The risks and benefits of the procedure including bleeding, infection, and unclear margins requiring further surgery were fully explained to the patient, who gives informed consent. ?

## 2022-01-22 NOTE — Patient Instructions (Signed)
? ? ? ? ? ? ? ? Kern Reap ? 01/22/2022  ?  ? '@PREFPERIOPPHARMACY'$ @ ? ? Your procedure is scheduled on  01/29/2022. ? ? Report to [facility] at  1045 A.M. ? ? Call this number if you have problems the morning of surgery: ? [phone] ? ? Remember: ? Do not eat or drink after midnight. ?  ?  ? Take these medicines the morning of surgery with A SIP OF WATER  ? ?Amlodipine. ?  ? ? Do not wear jewelry, make-up or nail polish. ? Do not wear lotions, powders, or perfumes, or deodorant. ? Do not shave 48 hours prior to surgery.  Men may shave face and neck. ? Do not bring valuables to the hospital. ? [facility] is not responsible for any belongings or valuables. ? ?Contacts, dentures or bridgework may not be worn into surgery.  Leave your suitcase in the car.  After surgery it may be brought to your room. ? ?For patients admitted to the hospital, discharge time will be determined by your treatment team. ? ?Patients discharged the day of surgery will not be allowed to drive home and must have someone with them for 24 hours.  ? ? ?Special instructions:   DO NOT smoke tobacco or vape for 24 hours before your procedure. ? ?Please read over the following fact sheets that you were given. ?Coughing and Deep Breathing, Surgical Site Infection Prevention, Anesthesia Post-op Instructions, and Care and Recovery After Surgery ?  ? ? ? Lumpectomy, Care After ?This sheet gives you information about how to care for yourself after your procedure. Your health care provider may also give you more specific instructions. If you have problems or questions, contact your health care provider. ?What can I expect after the procedure? ?After the procedure, it is common to have: ?Breast swelling. ?Breast tenderness. ?Stiffness in your arm or shoulder. ?A change in the shape and feel of your breast. ?Scar tissue that feels hard to the touch in the area where the lump was removed. ?Follow these instructions at home: ?Medicines ?Take  over-the-counter and prescription medicines only as told by your health care provider. ?If you were prescribed an antibiotic medicine, take it as told by your health care provider. Do not stop taking the antibiotic even if you start to feel better. ?Ask your health care provider if the medicine prescribed to you: ?Requires you to avoid driving or using heavy machinery. ?Can cause constipation. You may need to take these actions to prevent or treat constipation: ?Drink enough fluid to keep your urine pale yellow. ?Take over-the-counter or prescription medicines. ?Eat foods that are high in fiber, such as beans, whole grains, and fresh fruits and vegetables. ?Limit foods that are high in fat and processed sugars, such as fried or sweet foods. ?Incision care ?  ?Follow instructions from your health care provider about how to take care of your incision. Make sure you: ?Wash your hands with soap and water before and after you change your bandage (dressing). If soap and water are not available, use hand sanitizer. ?Change your dressing as told by your health care provider. ?Leave stitches (sutures), skin glue, or adhesive strips in place. These skin closures may need to stay in place for 2 weeks or longer. If adhesive strip edges start to loosen and curl up, you may trim the loose edges. Do not remove adhesive strips completely unless your health care provider tells you to do that. ?Check your incision area every day for  signs of infection. Check for: ?More redness, swelling, or pain. ?Fluid or blood. ?Warmth. ?Pus or a bad smell. ?Keep your dressing clean and dry. ?If you were sent home with a surgical drain in place, follow instructions from your health care provider about emptying it. ?Bathing ?Do not take baths, swim, or use a hot tub until your health care provider approves. ?Ask your health care provider if you may take showers. You may only be allowed to take sponge baths. ?Activity ?Rest as told by your health  care provider. ?Avoid sitting for a long time without moving. Get up to take short walks every 1-2 hours. This is important to improve blood flow and breathing. Ask for help if you feel weak or unsteady. ?Return to your normal activities as told by your health care provider. Ask your health care provider what activities are safe for you. ?Be careful to avoid any activities that could cause an injury to your arm on the side of your surgery. ?Do not lift anything that is heavier than 10 lb (4.5 kg), or the limit that you are told, until your health care provider says that it is safe. Avoid lifting with the arm that is on the side of your surgery. ?Do not carry heavy objects on your shoulder on the side of your surgery. ?Do exercises to keep your shoulder and arm from getting stiff and swollen. Talk with your health care provider about which exercises are safe for you. ?General instructions ?Wear a supportive bra as told by your health care provider. ?Raise (elevate) your arm above the level of your heart while you are sitting or lying down. ?Do not wear tight jewelry on your arm, wrist, or fingers on the side of your surgery. ?Keep all follow-up visits as told by your health care provider. This is important. ?You may need to be screened for extra fluid around the lymph nodes and swelling in the breast and arm (lymphedema). Follow instructions from your health care provider about how often you should be checked. ?If you had any lymph nodes removed during your procedure, be sure to tell all of your health care providers. This is important information to share before you are involved in certain procedures, such as having blood tests or having your blood pressure taken. ?Contact a health care provider if: ?You develop a rash. ?You have a fever. ?Your pain medicine is not working. ?You have swelling, weakness, or numbness in your arm that does not improve after a few weeks. ?You have new swelling in your breast. ?You have  any of these signs of infection: ?More redness, swelling, or pain in your incision area. ?Fluid or blood coming from your incision. ?Warmth coming from the incision area. ?Pus or a bad smell coming from your incision. ?Get help right away if you have: ?Very bad pain in your breast or arm. ?Swelling in your legs or arms. ?Redness, warmth, or pain in your leg or arm. ?Chest pain. ?Difficulty breathing. ?Summary ?After the procedure, it is common to have breast tenderness, swelling in your breast, and stiffness in your arm and shoulder. ?Follow instructions from your health care provider about how to take care of your incision. ?Do not lift anything that is heavier than 10 lb (4.5 kg), or the limit that you are told, until your health care provider says that it is safe. Avoid lifting with the arm that is on the side of your surgery. ?If you had any lymph nodes removed during your procedure, be  sure to tell all of your health care providers. This is important information to share before you are involved in certain procedures, such as having blood tests or having your blood pressure taken. ?This information is not intended to replace advice given to you by your health care provider. Make sure you discuss any questions you have with your health care provider. ?Document Revised: 04/27/2019 Document Reviewed: 04/27/2019 ?Elsevier Patient Education ? [place]. ?General Anesthesia, Adult, Care After ?This sheet gives you information about how to care for yourself after your procedure. Your health care provider may also give you more specific instructions. If you have problems or questions, contact your health care provider. ?What can I expect after the procedure? ?After the procedure, the following side effects are common: ?Pain or discomfort at the IV site. ?Nausea. ?Vomiting. ?Sore throat. ?Trouble concentrating. ?Feeling cold or chills. ?Feeling weak or tired. ?Sleepiness and fatigue. ?Soreness and body aches.  These side effects can affect parts of the body that were not involved in surgery. ?Follow these instructions at home: ?For the time period you were told by your health care provider: ? ?Rest. ?Do not participate in a

## 2022-01-24 ENCOUNTER — Ambulatory Visit (HOSPITAL_COMMUNITY)
Admission: RE | Admit: 2022-01-24 | Discharge: 2022-01-24 | Disposition: A | Discharge status: Home / Self Care | Payer: Medicare Other | Source: Ambulatory Visit | Attending: General Surgery | Admitting: General Surgery

## 2022-01-24 ENCOUNTER — Encounter (HOSPITAL_COMMUNITY): Payer: Self-pay

## 2022-01-24 ENCOUNTER — Encounter (HOSPITAL_COMMUNITY)
Admission: RE | Admit: 2022-01-24 | Discharge: 2022-01-24 | Disposition: A | Discharge status: Home / Self Care | Payer: Medicare Other | Source: Ambulatory Visit | Attending: General Surgery | Admitting: General Surgery

## 2022-01-24 ENCOUNTER — Other Ambulatory Visit: Payer: Self-pay

## 2022-01-24 DIAGNOSIS — C50911 Malignant neoplasm of unspecified site of right female breast: Secondary | ICD-10-CM | POA: Insufficient documentation

## 2022-01-24 DIAGNOSIS — Z01818 Encounter for other preprocedural examination: Secondary | ICD-10-CM | POA: Diagnosis not present

## 2022-01-24 DIAGNOSIS — C50311 Malignant neoplasm of lower-inner quadrant of right female breast: Secondary | ICD-10-CM | POA: Insufficient documentation

## 2022-01-24 DIAGNOSIS — Z170 Estrogen receptor positive status [ER+]: Secondary | ICD-10-CM | POA: Diagnosis not present

## 2022-01-24 LAB — CBC WITH DIFFERENTIAL/PLATELET
Abs Immature Granulocytes: 0.02 10*3/uL (ref 0.00–0.07)
Basophils Absolute: 0.1 10*3/uL (ref 0.0–0.1)
Basophils Relative: 1 %
Eosinophils Absolute: 0.1 10*3/uL (ref 0.0–0.5)
Eosinophils Relative: 2 %
HCT: 35.4 % — ABNORMAL LOW (ref 36.0–46.0)
Hemoglobin: 11.2 g/dL — ABNORMAL LOW (ref 12.0–15.0)
Immature Granulocytes: 0 %
Lymphocytes Relative: 25 %
Lymphs Abs: 2.1 10*3/uL (ref 0.7–4.0)
MCH: 27.2 pg (ref 26.0–34.0)
MCHC: 31.6 g/dL (ref 30.0–36.0)
MCV: 85.9 fL (ref 80.0–100.0)
Monocytes Absolute: 0.5 10*3/uL (ref 0.1–1.0)
Monocytes Relative: 6 %
Neutro Abs: 5.7 10*3/uL (ref 1.7–7.7)
Neutrophils Relative %: 66 %
Platelets: 310 10*3/uL (ref 150–400)
RBC: 4.12 MIL/uL (ref 3.87–5.11)
RDW: 15.2 % (ref 11.5–15.5)
WBC: 8.4 10*3/uL (ref 4.0–10.5)
nRBC: 0 % (ref 0.0–0.2)

## 2022-01-24 LAB — COMPREHENSIVE METABOLIC PANEL
ALT: 27 U/L (ref 0–44)
AST: 25 U/L (ref 15–41)
Albumin: 3.5 g/dL (ref 3.5–5.0)
Alkaline Phosphatase: 86 U/L (ref 38–126)
Anion gap: 10 (ref 5–15)
BUN: 26 mg/dL — ABNORMAL HIGH (ref 8–23)
CO2: 22 mmol/L (ref 22–32)
Calcium: 8.7 mg/dL — ABNORMAL LOW (ref 8.9–10.3)
Chloride: 106 mmol/L (ref 98–111)
Creatinine, Ser: 1.21 mg/dL — ABNORMAL HIGH (ref 0.44–1.00)
GFR, Estimated: 44 mL/min — ABNORMAL LOW (ref >60)
Glucose, Bld: 89 mg/dL (ref 70–99)
Potassium: 4.2 mmol/L (ref 3.5–5.1)
Sodium: 138 mmol/L (ref 135–145)
Total Bilirubin: 0.5 mg/dL (ref 0.3–1.2)
Total Protein: 7 g/dL (ref 6.5–8.1)

## 2022-01-29 ENCOUNTER — Ambulatory Visit (HOSPITAL_COMMUNITY): Payer: Medicare Other | Admitting: Anesthesiology

## 2022-01-29 ENCOUNTER — Ambulatory Visit (HOSPITAL_COMMUNITY)
Admission: RE | Admit: 2022-01-29 | Discharge: 2022-01-29 | Disposition: A | Discharge status: Home / Self Care | Payer: Medicare Other | Attending: General Surgery | Admitting: General Surgery

## 2022-01-29 ENCOUNTER — Other Ambulatory Visit: Payer: Self-pay

## 2022-01-29 ENCOUNTER — Ambulatory Visit (HOSPITAL_BASED_OUTPATIENT_CLINIC_OR_DEPARTMENT_OTHER): Payer: Medicare Other | Admitting: Anesthesiology

## 2022-01-29 ENCOUNTER — Encounter (HOSPITAL_COMMUNITY)
Admission: RE | Disposition: A | Discharge status: Home / Self Care | Payer: Self-pay | Source: Home / Self Care | Attending: General Surgery

## 2022-01-29 ENCOUNTER — Encounter (HOSPITAL_COMMUNITY): Payer: Self-pay | Admitting: General Surgery

## 2022-01-29 DIAGNOSIS — I10 Essential (primary) hypertension: Secondary | ICD-10-CM | POA: Insufficient documentation

## 2022-01-29 DIAGNOSIS — Z87891 Personal history of nicotine dependence: Secondary | ICD-10-CM | POA: Insufficient documentation

## 2022-01-29 DIAGNOSIS — Z170 Estrogen receptor positive status [ER+]: Secondary | ICD-10-CM | POA: Diagnosis not present

## 2022-01-29 DIAGNOSIS — C50911 Malignant neoplasm of unspecified site of right female breast: Secondary | ICD-10-CM

## 2022-01-29 DIAGNOSIS — C50311 Malignant neoplasm of lower-inner quadrant of right female breast: Secondary | ICD-10-CM

## 2022-01-29 HISTORY — PX: MASTECTOMY, PARTIAL: SHX709

## 2022-01-29 SURGERY — MASTECTOMY PARTIAL
Anesthesia: General | Site: Breast | Laterality: Right

## 2022-01-29 MED ORDER — CHLORHEXIDINE GLUCONATE 0.12 % MT SOLN
OROMUCOSAL | Status: AC
  Administered 2022-01-29: 15 mL
  Filled 2022-01-29: qty 15

## 2022-01-29 MED ORDER — ONDANSETRON HCL 4 MG/2ML IJ SOLN
INTRAMUSCULAR | Status: AC
  Filled 2022-01-29: qty 2

## 2022-01-29 MED ORDER — CHLORHEXIDINE GLUCONATE CLOTH 2 % EX PADS: 6.0000 | MEDICATED_PAD | Freq: Once | CUTANEOUS | Status: DC

## 2022-01-29 MED ORDER — 0.9 % SODIUM CHLORIDE (POUR BTL) OPTIME
TOPICAL | Status: DC | PRN
  Administered 2022-01-29: 1000 mL

## 2022-01-29 MED ORDER — BUPIVACAINE LIPOSOME 1.3 % IJ SUSP
INTRAMUSCULAR | Status: AC
  Filled 2022-01-29: qty 20

## 2022-01-29 MED ORDER — TRAMADOL HCL 50 MG PO TABS
50.0000 mg | ORAL_TABLET | Freq: Two times a day (BID) | ORAL | 0 refills | Status: AC | PRN
Start: 2022-01-29 — End: ?

## 2022-01-29 MED ORDER — KETOROLAC TROMETHAMINE 30 MG/ML IJ SOLN
15.0000 mg | Freq: Once | INTRAMUSCULAR | Status: AC
  Administered 2022-01-29: 15 mg via INTRAVENOUS
  Filled 2022-01-29: qty 1

## 2022-01-29 MED ORDER — CEFAZOLIN SODIUM-DEXTROSE 2-4 GM/100ML-% IV SOLN
2.0000 g | INTRAVENOUS | Status: AC
  Administered 2022-01-29: 2 g via INTRAVENOUS
  Filled 2022-01-29: qty 100

## 2022-01-29 MED ORDER — FENTANYL CITRATE (PF) 100 MCG/2ML IJ SOLN
INTRAMUSCULAR | Status: DC | PRN
  Administered 2022-01-29 (×2): 50 ug via INTRAVENOUS

## 2022-01-29 MED ORDER — SODIUM CHLORIDE 0.9 % IV SOLN
INTRAVENOUS | Status: DC | PRN
  Administered 2022-01-29: 20 mL

## 2022-01-29 MED ORDER — PROPOFOL 10 MG/ML IV BOLUS
INTRAVENOUS | Status: DC | PRN
  Administered 2022-01-29: 20 mg via INTRAVENOUS
  Administered 2022-01-29: 140 mg via INTRAVENOUS
  Administered 2022-01-29: 20 mg via INTRAVENOUS

## 2022-01-29 MED ORDER — CEFAZOLIN SODIUM-DEXTROSE 2-4 GM/100ML-% IV SOLN
INTRAVENOUS | Status: AC
  Filled 2022-01-29: qty 100

## 2022-01-29 MED ORDER — FENTANYL CITRATE (PF) 100 MCG/2ML IJ SOLN
INTRAMUSCULAR | Status: AC
Start: 2022-01-29 — End: ?
  Filled 2022-01-29: qty 2

## 2022-01-29 MED ORDER — LIDOCAINE HCL (CARDIAC) PF 100 MG/5ML IV SOSY
PREFILLED_SYRINGE | INTRAVENOUS | Status: DC | PRN
Start: 2022-01-29 — End: 2022-01-29
  Administered 2022-01-29: 50 mg via INTRAVENOUS

## 2022-01-29 MED ORDER — LACTATED RINGERS IV SOLN: INTRAVENOUS | Status: DC

## 2022-01-29 MED ORDER — ORAL CARE MOUTH RINSE: 15.0000 mL | Freq: Once | OROMUCOSAL | Status: DC

## 2022-01-29 MED ORDER — CHLORHEXIDINE GLUCONATE 0.12 % MT SOLN: 15.0000 mL | Freq: Once | OROMUCOSAL | Status: DC

## 2022-01-29 MED ORDER — ONDANSETRON HCL 4 MG/2ML IJ SOLN
INTRAMUSCULAR | Status: DC | PRN
Start: 2022-01-29 — End: 2022-01-29
  Administered 2022-01-29: 4 mg via INTRAVENOUS

## 2022-01-29 MED ORDER — PROPOFOL 10 MG/ML IV BOLUS
INTRAVENOUS | Status: AC
  Filled 2022-01-29: qty 20

## 2022-01-29 MED ORDER — FENTANYL CITRATE PF 50 MCG/ML IJ SOSY: 25.0000 ug | PREFILLED_SYRINGE | INTRAMUSCULAR | Status: DC | PRN

## 2022-01-29 MED ORDER — ONDANSETRON HCL 4 MG/2ML IJ SOLN: 4.0000 mg | Freq: Once | INTRAMUSCULAR | Status: DC | PRN

## 2022-01-29 SURGICAL SUPPLY — 27 items
ADH SKN CLS APL DERMABOND .7 (GAUZE/BANDAGES/DRESSINGS) ×1
APL PRP STRL LF DISP 70% ISPRP (MISCELLANEOUS) ×1
CHLORAPREP W/TINT 26 (MISCELLANEOUS) ×2 IMPLANT
CLOTH BEACON ORANGE TIMEOUT ST (SAFETY) ×2 IMPLANT
COVER LIGHT HANDLE STERIS (MISCELLANEOUS) ×4 IMPLANT
DERMABOND ADVANCED (GAUZE/BANDAGES/DRESSINGS) ×1
DERMABOND ADVANCED .7 DNX12 (GAUZE/BANDAGES/DRESSINGS) ×1 IMPLANT
ELECT REM PT RETURN 9FT ADLT (ELECTROSURGICAL) ×2
ELECTRODE REM PT RTRN 9FT ADLT (ELECTROSURGICAL) ×1 IMPLANT
GLOVE SURG POLYISO LF SZ7 (GLOVE) ×1 IMPLANT
GLOVE SURG POLYISO LF SZ7.5 (GLOVE) ×2 IMPLANT
GLOVE SURG UNDER POLY LF SZ7 (GLOVE) ×4 IMPLANT
GOWN STRL REUS W/TWL LRG LVL3 (GOWN DISPOSABLE) ×4 IMPLANT
KIT TURNOVER KIT A (KITS) ×2 IMPLANT
MANIFOLD NEPTUNE II (INSTRUMENTS) ×2 IMPLANT
NDL HYPO 25X1 1.5 SAFETY (NEEDLE) ×1 IMPLANT
NEEDLE HYPO 25X1 1.5 SAFETY (NEEDLE) ×2 IMPLANT
NS IRRIG 1000ML POUR BTL (IV SOLUTION) ×2 IMPLANT
PACK MINOR (CUSTOM PROCEDURE TRAY) ×2 IMPLANT
PAD ARMBOARD 7.5X6 YLW CONV (MISCELLANEOUS) ×2 IMPLANT
SET BASIN LINEN APH (SET/KITS/TRAYS/PACK) ×2 IMPLANT
SPONGE T-LAP 18X18 ~~LOC~~+RFID (SPONGE) ×2 IMPLANT
SUT MNCRL AB 4-0 PS2 18 (SUTURE) ×2 IMPLANT
SUT SILK 2 0 SH (SUTURE) ×1 IMPLANT
SUT VIC AB 3-0 SH 27 (SUTURE) ×4
SUT VIC AB 3-0 SH 27X BRD (SUTURE) ×1 IMPLANT
SYR CONTROL 10ML LL (SYRINGE) ×2 IMPLANT

## 2022-01-29 NOTE — Anesthesia Preprocedure Evaluation (Addendum)
Anesthesia Evaluation  ?Patient identified by MRN, date of birth, ID band ?Patient awake ? ? ? ?Reviewed: ?Allergy & Precautions, H&P , NPO status , Patient's Chart, lab work & pertinent test results, reviewed documented beta blocker date and time  ? ?Airway ?Mallampati: II ? ?TM Distance: >3 FB ?Neck ROM: full ? ? ? Dental ?no notable dental hx. ? ?  ?Pulmonary ?neg pulmonary ROS, former smoker,  ?  ?Pulmonary exam normal ?breath sounds clear to auscultation ? ? ? ? ? ? Cardiovascular ?Exercise Tolerance: Good ?hypertension, negative cardio ROS ? ? ?Rhythm:regular Rate:Normal ? ? ?  ?Neuro/Psych ?negative neurological ROS ? negative psych ROS  ? GI/Hepatic ?negative GI ROS, Neg liver ROS,   ?Endo/Other  ?negative endocrine ROS ? Renal/GU ?negative Renal ROS  ?negative genitourinary ?  ?Musculoskeletal ? ? Abdominal ?  ?Peds ? Hematology ?negative hematology ROS ?(+)   ?Anesthesia Other Findings ? ? Reproductive/Obstetrics ?negative OB ROS ? ?  ? ? ? ? ? ? ? ? ? ? ? ? ? ?  ?  ? ? ? ? ? ? ? ? ?Anesthesia Physical ?Anesthesia Plan ? ?ASA: 2 ? ?Anesthesia Plan: General and General LMA  ? ?Post-op Pain Management:   ? ?Induction:  ? ?PONV Risk Score and Plan: Ondansetron ? ?Airway Management Planned:  ? ?Additional Equipment:  ? ?Intra-op Plan:  ? ?Post-operative Plan:  ? ?Informed Consent: I have reviewed the patients History and Physical, chart, labs and discussed the procedure including the risks, benefits and alternatives for the proposed anesthesia with the patient or authorized representative who has indicated his/her understanding and acceptance.  ? ? ? ?Dental Advisory Given ? ?Plan Discussed with: CRNA ? ?Anesthesia Plan Comments:   ? ? ? ? ? ?Anesthesia Quick Evaluation ? ?

## 2022-01-29 NOTE — Anesthesia Procedure Notes (Signed)
Procedure Name: LMA Insertion ?Date/Time: 01/29/2022 11:31 AM ?Performed by: Jonna Munro, CRNA ?Pre-anesthesia Checklist: Patient identified, Emergency Drugs available, Suction available, Patient being monitored and Timeout performed ?Patient Re-evaluated:Patient Re-evaluated prior to induction ?Oxygen Delivery Method: Circle system utilized ?Preoxygenation: Pre-oxygenation with 100% oxygen ?Induction Type: IV induction ?LMA: LMA inserted ?LMA Size: 4.0 ?Number of attempts: 1 ?Placement Confirmation: positive ETCO2 and breath sounds checked- equal and bilateral ?Tube secured with: Tape ?Dental Injury: Teeth and Oropharynx as per pre-operative assessment  ? ? ? ? ?

## 2022-01-29 NOTE — Interval H&P Note (Signed)
History and Physical Interval Note: ? ?01/29/2022 ?11:01 AM ? ?Kristi Graham  has presented today for surgery, with the diagnosis of Right Breast Cancer.  The various methods of treatment have been discussed with the patient and family. After consideration of risks, benefits and other options for treatment, the patient has consented to  Procedure(s): ?MASTECTOMY PARTIAL (Right) as a surgical intervention.  The patient's history has been reviewed, patient examined, no change in status, stable for surgery.  I have reviewed the patient's chart and labs.  Questions were answered to the patient's satisfaction.   ? ? ?Aviva Signs ? ? ?

## 2022-01-29 NOTE — Transfer of Care (Signed)
Immediate Anesthesia Transfer of Care Note ? ?Patient: Kristi Graham ? ?Procedure(s) Performed: MASTECTOMY PARTIAL (Right: Breast) ? ?Patient Location: PACU ? ?Anesthesia Type:General ? ?Level of Consciousness: awake, alert , oriented and patient cooperative ? ?Airway & Oxygen Therapy: Patient Spontanous Breathing and Patient connected to nasal cannula oxygen ? ?Post-op Assessment: Report given to RN, Post -op Vital signs reviewed and stable and Patient moving all extremities X 4 ? ?Post vital signs: Reviewed and stable ? ?Last Vitals:  ?Vitals Value Taken Time  ?BP    ?Temp    ?Pulse 77 01/29/22 1216  ?Resp 17 01/29/22 1216  ?SpO2 95 % 01/29/22 1216  ?Vitals shown include unvalidated device data. ? ?Last Pain:  ?Vitals:  ? 01/29/22 1058  ?TempSrc: Oral  ?PainSc: 0-No pain  ?   ? ?  ? ?Complications: No notable events documented. ?

## 2022-01-29 NOTE — Op Note (Signed)
Patient:  Kristi Graham ? ?DOB:  03/29/1933 ? ?MRN:  [id number] ? ? ?Preop Diagnosis: Right breast carcinoma ? ?Postop Diagnosis: Same ? ?Procedure: Right partial mastectomy ? ?Surgeon: Aviva Signs, MD ? ?Anes: General ? ?Indications: Patient is an 88 year old white female who was recently found to have right breast cancer.  After discussion with oncology and the patient, she has elected to receive a right partial mastectomy.  The risks and benefits of the procedure including bleeding, infection, and the possibility of unclear margins were fully explained to the patient, who gave informed consent. ? ?Procedure note: The patient was placed in supine position.  After general anesthesia was administered, the right breast was prepped and draped using usual sterile technique with ChloraPrep.  Surgical site confirmation was performed. ? ?The mass was located along the medial aspect of the right breast at the areolar line.  A transverse incision was made over this area.  The dissection was taken down to the pectoralis major muscle.  Normal tissue was noted around the palpable mass, although there were some sclerotic attachments to the posterior areola.  These were probably secondary to age.  A short suture was placed superiorly and a long suture was placed laterally for orientation purposes.  The specimen was sent to pathology for further examination.  A bleeding was controlled using Bovie electrocautery.  The wound was irrigated with normal saline.  The subcutaneous layer was reapproximated using 3-0 Vicryl interrupted sutures.  Exparel was instilled into the surrounding wound.  The skin was closed using a 4-0 Monocryl subcuticular suture.  Dermabond was applied. ? ?All tape and needle counts were correct at the end of the procedure.  The patient was awakened and transferred to PACU in stable condition. ? ?Complications: None ? ?EBL: Minimal ? ?Specimen: Right breast tissue ? ? ?  ?

## 2022-01-30 NOTE — Anesthesia Postprocedure Evaluation (Signed)
Anesthesia Post Note ? ?Patient: Kristi Graham ? ?Procedure(s) Performed: MASTECTOMY PARTIAL (Right: Breast) ? ?Patient location during evaluation: Phase II ?Anesthesia Type: General ?Level of consciousness: awake ?Pain management: pain level controlled ?Vital Signs Assessment: post-procedure vital signs reviewed and stable ?Respiratory status: spontaneous breathing and respiratory function stable ?Cardiovascular status: blood pressure returned to baseline and stable ?Postop Assessment: no headache and no apparent nausea or vomiting ?Anesthetic complications: no ?Comments: Late entry ? ? ?No notable events documented. ? ? ?Last Vitals:  ?Vitals:  ? 01/29/22 1230 01/29/22 1319  ?BP: (!) 141/58 (!) (P) 145/84  ?Pulse: 72   ?Resp: 14 (P) 14  ?Temp:  (P) 36.7 ?C  ?SpO2: 98% (P) 100%  ?  ?Last Pain:  ?Vitals:  ? 01/29/22 1319  ?TempSrc: (P) Oral  ?PainSc: (P) 0-No pain  ? ? ?  ?  ?  ?  ?  ?  ? ?Louann Sjogren ? ? ? ? ?

## 2022-01-31 ENCOUNTER — Encounter (HOSPITAL_COMMUNITY): Payer: Self-pay | Admitting: General Surgery

## 2022-01-31 LAB — SURGICAL PATHOLOGY

## 2022-02-06 ENCOUNTER — Other Ambulatory Visit: Payer: Self-pay

## 2022-02-06 ENCOUNTER — Ambulatory Visit (INDEPENDENT_AMBULATORY_CARE_PROVIDER_SITE_OTHER): Payer: Medicare Other | Admitting: General Surgery

## 2022-02-06 ENCOUNTER — Encounter: Payer: Self-pay | Admitting: General Surgery

## 2022-02-06 VITALS — BP 161/73 | HR 80 | Temp 97.2°F | Resp 14 | Ht 65.0 in | Wt 179.0 lb

## 2022-02-06 DIAGNOSIS — Z09 Encounter for follow-up examination after completed treatment for conditions other than malignant neoplasm: Secondary | ICD-10-CM

## 2022-02-06 NOTE — Progress Notes (Signed)
Subjective:  ?  ? Kristi Graham  ?Patient here for follow-up, status post right partial mastectomy for invasive ductal carcinoma.  Patient is doing well.  She has no complaints. ?Objective:  ? ? BP (!) 161/73   Pulse 80   Temp (!) 97.2 ?F (36.2 ?C) (Other (Comment))   Resp 14   Ht 5' 5"  (1.651 m)   Wt 179 lb (81.2 kg)   SpO2 94%   BMI 29.79 kg/m?  ? ?General:  alert, cooperative, and no distress  ?Right breast incision well-healed.  No swelling present. ?Final pathology revealed a 3.3 cm invasive ductal carcinoma, margins clear.  T2, N0, M0.  ER/PR positive, HER2 negative. ?   ? ?Assessment:  ? ? Doing well postoperatively.  ?  ?Plan:  ? ?Patient would like to follow-up with Dr. Delton Coombes.  Patient's daughter will call to schedule the appointment.  Follow-up here as needed. ?

## 2022-02-15 ENCOUNTER — Encounter (HOSPITAL_COMMUNITY): Payer: Self-pay | Admitting: Hematology

## 2022-02-15 ENCOUNTER — Encounter (HOSPITAL_COMMUNITY): Payer: Self-pay

## 2022-02-15 ENCOUNTER — Inpatient Hospital Stay (HOSPITAL_COMMUNITY): Payer: Medicare Other | Attending: Hematology | Admitting: Hematology

## 2022-02-15 VITALS — BP 139/63 | HR 78 | Temp 97.6°F | Resp 16 | Ht 65.0 in | Wt 180.6 lb

## 2022-02-15 DIAGNOSIS — Z79899 Other long term (current) drug therapy: Secondary | ICD-10-CM | POA: Insufficient documentation

## 2022-02-15 DIAGNOSIS — Z79818 Long term (current) use of other agents affecting estrogen receptors and estrogen levels: Secondary | ICD-10-CM | POA: Diagnosis not present

## 2022-02-15 DIAGNOSIS — C50311 Malignant neoplasm of lower-inner quadrant of right female breast: Secondary | ICD-10-CM

## 2022-02-15 DIAGNOSIS — Z87891 Personal history of nicotine dependence: Secondary | ICD-10-CM | POA: Diagnosis not present

## 2022-02-15 DIAGNOSIS — E559 Vitamin D deficiency, unspecified: Secondary | ICD-10-CM | POA: Diagnosis not present

## 2022-02-15 DIAGNOSIS — Z170 Estrogen receptor positive status [ER+]: Secondary | ICD-10-CM | POA: Insufficient documentation

## 2022-02-15 DIAGNOSIS — C50211 Malignant neoplasm of upper-inner quadrant of right female breast: Secondary | ICD-10-CM | POA: Insufficient documentation

## 2022-02-15 MED ORDER — ANASTROZOLE 1 MG PO TABS: 1.0000 mg | ORAL_TABLET | Freq: Every day | ORAL | 3 refills | Status: AC

## 2022-02-15 NOTE — Progress Notes (Signed)
I met with the patient and her daughter today during and following initial visit with Dr. Delton Coombes. I provided my contact information and encouraged them to call with questions or concerns. ?

## 2022-02-15 NOTE — Patient Instructions (Addendum)
[facility] ?Discharge Instructions ? ?You were seen and examined today by Dr. Delton Coombes. Dr. Delton Coombes is a medical oncologist, meaning that he specializes in the treatment of cancer diagnoses. Dr. Delton Coombes discussed your past medical history, family history of cancers, and the events that led to you being here today. ? ?You were referred to Dr. Delton Coombes by Dr. Arnoldo Morale due to your new diagnosis of Stage I hormone receptor (estrogen and progesterone) positive invasive ductal carcinoma, this is a common type of breast cancer. It is being fed by the naturally occurring within your body. The cancer was completely removed during surgery, the goal is to prevent the cancer from returning. ? ?Dr. Delton Coombes has discussed an antiestrogen pill known as Anastrozole. It is taken once daily for at least 5 years. Long term use of this medication can lead to decreased bone strength, Dr. Delton Coombes has recommended checking a baseline Vitamin D level (blood word) and a bone density scan, which is a whole body bone scan to see the current strength of your bones. ? ?The most common side effect of Anastrozole is hot flashes and body aches. These typically subside within a couple months of starting the medication. While you are taking Anastrozole, please also take Vitamin D and Calcium supplements daily. You can get these over-the-counter at any local pharmacy. ? ?Follow-up with Dr. Delton Coombes in approximately 3 months. ? ? ?Thank you for choosing [facility] to provide your oncology and hematology care.  To afford each patient quality time with our provider, please arrive at least 15 minutes before your scheduled appointment time.  ? ?If you have a lab appointment with the [facility] please come in thru the Main Entrance and check in at the main information desk. ? ?You need to re-schedule your appointment should you arrive 10 or more minutes late.  We  strive to give you quality time with our providers, and arriving late affects you and other patients whose appointments are after yours.  Also, if you no show three or more times for appointments you may be dismissed from the clinic at the providers discretion.     ?Again, thank you for choosing [facility].  Our hope is that these requests will decrease the amount of time that you wait before being seen by our physicians.       ?_____________________________________________________________ ? ?Should you have questions after your visit to [facility], please contact our office at [phone] and follow the prompts.  Our office hours are 8:00 a.m. and 4:30 p.m. Monday - Friday.  Please note that voicemails left after 4:00 p.m. may not be returned until the following business day.  We are closed weekends and major holidays.  You do have access to a nurse 24-7, just call the main number to the clinic [phone] and do not press any options, hold on the line and a nurse will answer the phone.   ? ?For prescription refill requests, have your pharmacy contact our office and allow 72 hours.   ? ?Due to Covid, you will need to wear a mask upon entering the hospital. If you do not have a mask, a mask will be given to you at the Main Entrance upon arrival. For doctor visits, patients may have 1 support person age 21 or older with them. For treatment visits, patients can not have anyone with them due to social distancing guidelines and our immunocompromised  population.  ? ? ? ?

## 2022-02-15 NOTE — Progress Notes (Signed)
? ?[name] ?[address] ?[place], [place] [address] ? ? ?Patient Care Team: ?Neale Burly, MD as PCP - General (Internal Medicine) ?Derek Jack, MD as Medical Oncologist (Medical Oncology) ? ?CHIEF COMPLAINTS/PURPOSE OF CONSULTATION:  ?Newly diagnosed right breast cancer ? ?HISTORY OF PRESENTING ILLNESS:  ?Kristi Graham 88 y.o. female is here because of recent diagnosis of right breast cancer.  ? ?Today she reports feeling good, and she is accompanied by her daughter. She never previously required breast biopsies. She was not receiving regular mammograms. She denies any current pains. She is not currently taking calcium or vitamin D. She denies history of CVA and MI. She reports a previous TIA. She denies ankle swellings.  ? ?She lives at home on her own, and her daughter lives nearby. Since fracturing her hip in a fall last year, her daughter drives for her. Prior to retirement she worked at a [employer]. She quit smoking 45 years ago. She denies family history of cancer.  ? ?In terms of breast cancer risk profile:  ?She menarched at early age of 46 and went to menopause at age 28  ?She had 1 pregnancy, her first child was born at age 75  ?She never received birth control pills.  ?She was never exposed to fertility medications or hormone replacement therapy.  ?She does not have family history of Breast/GYN/GI cancer ? ?I reviewed her records extensively and collaborated the history with the patient. ? ?SUMMARY OF ONCOLOGIC HISTORY: ?Oncology History  ? No history exists.  ? ? ?MEDICAL HISTORY:  ?Past Medical History:  ?Diagnosis Date  ? Essential hypertension   ? Viral meningitis   ? ? ?SURGICAL HISTORY: ?Past Surgical History:  ?Procedure Laterality Date  ? HIP FRACTURE SURGERY Right 02/2021  ? MASTECTOMY, PARTIAL Right 01/29/2022  ? Procedure: MASTECTOMY PARTIAL;  Surgeon: Aviva Signs, MD;  Location: [facility];  Service: General;  Laterality: Right;  ? TONSILLECTOMY    ? ? ?SOCIAL  HISTORY: ?Social History  ? ?Socioeconomic History  ? Marital status: Single  ?  Spouse name: Not on file  ? Number of children: Not on file  ? Years of education: Not on file  ? Highest education level: Not on file  ?Occupational History  ? Not on file  ?Tobacco Use  ? Smoking status: Former  ?  Types: Cigarettes  ?  Passive exposure: Past  ? Smokeless tobacco: Never  ?Vaping Use  ? Vaping Use: Never used  ?Substance and Sexual Activity  ? Alcohol use: No  ? Drug use: No  ? Sexual activity: Never  ?Other Topics Concern  ? Not on file  ?Social History Narrative  ? Not on file  ? ?Social Determinants of Health  ? ?Financial Resource Strain: Not on file  ?Food Insecurity: Not on file  ?Transportation Needs: Not on file  ?Physical Activity: Not on file  ?Stress: Not on file  ?Social Connections: Not on file  ?Intimate Partner Violence: Not on file  ? ? ?FAMILY HISTORY: ?Family History  ?Problem Relation Age of Onset  ? Dementia Mother   ? Heart attack Father   ? ? ?ALLERGIES:  has No Known Allergies. ? ?MEDICATIONS:  ?Current Outpatient Medications  ?Medication Sig Dispense Refill  ? acetaminophen (TYLENOL) 325 MG tablet Take 650 mg by mouth every 6 (six) hours as needed (for pain.).    ? amLODipine (NORVASC) 5 MG tablet Take 5 mg by mouth in the morning.    ? aspirin 81  MG EC tablet Take 81 mg by mouth in the morning.    ? atorvastatin (LIPITOR) 20 MG tablet Take 20 mg by mouth every evening.    ? traMADol (ULTRAM) 50 MG tablet Take 1 tablet (50 mg total) by mouth every 12 (twelve) hours as needed. 15 tablet 0  ? ?No current facility-administered medications for this visit.  ? ? ?REVIEW OF SYSTEMS:   ?Review of Systems  ?Constitutional:  Negative for appetite change and fatigue.  ?Cardiovascular:  Negative for leg swelling.  ?Gastrointestinal:  Positive for constipation.  ?All other systems reviewed and are negative. ? ?PHYSICAL EXAMINATION: ?ECOG PERFORMANCE STATUS: 1 - Symptomatic but completely  ambulatory ? ?Vitals:  ? 02/15/22 0814  ?BP: 139/63  ?Pulse: 78  ?Resp: 16  ?Temp: 97.6 ?F (36.4 ?C)  ?SpO2: 99%  ? ?Filed Weights  ? 02/15/22 0814  ?Weight: 180 lb 9.6 oz (81.9 kg)  ? ?Physical Exam ?Vitals reviewed.  ?Constitutional:   ?   Appearance: Normal appearance.  ?Cardiovascular:  ?   Rate and Rhythm: Normal rate and regular rhythm.  ?   Pulses: Normal pulses.  ?   Heart sounds: Normal heart sounds.  ?Pulmonary:  ?   Effort: Pulmonary effort is normal.  ?   Breath sounds: Normal breath sounds.  ?Chest:  ?Breasts: ?   Right: No swelling, bleeding, inverted nipple, mass, nipple discharge, skin change (lumpectomy scar UIQ) or tenderness.  ?   Left: No swelling, bleeding, inverted nipple, mass, nipple discharge, skin change or tenderness.  ?Abdominal:  ?   Palpations: Abdomen is soft. There is no mass.  ?   Tenderness: There is no abdominal tenderness.  ?Musculoskeletal:  ?   Right lower leg: No edema.  ?   Left lower leg: No edema.  ?Lymphadenopathy:  ?   Upper Body:  ?   Right upper body: No supraclavicular, axillary or pectoral adenopathy.  ?   Left upper body: No supraclavicular, axillary or pectoral adenopathy.  ?   Lower Body: No right inguinal adenopathy. No left inguinal adenopathy.  ?Neurological:  ?   General: No focal deficit present.  ?   Mental Status: She is alert and oriented to person, place, and time.  ?Psychiatric:     ?   Mood and Affect: Mood normal.     ?   Behavior: Behavior normal.  ? ? ?Breast Exam Chaperone: Thana Ates   ? ?LABORATORY DATA:  ?I have reviewed the data as listed ?Recent Results (from the past 2160 hour(s))  ?CBC WITH DIFFERENTIAL     Status: Abnormal  ? Collection Time: 01/24/22  3:13 PM  ?Result Value Ref Range  ? WBC 8.4 4.0 - 10.5 K/uL  ? RBC 4.12 3.87 - 5.11 MIL/uL  ? Hemoglobin 11.2 (L) 12.0 - 15.0 g/dL  ? HCT 35.4 (L) 36.0 - 46.0 %  ? MCV 85.9 80.0 - 100.0 fL  ? MCH 27.2 26.0 - 34.0 pg  ? MCHC 31.6 30.0 - 36.0 g/dL  ? RDW 15.2 11.5 - 15.5 %  ? Platelets 310 150  - 400 K/uL  ? nRBC 0.0 0.0 - 0.2 %  ? Neutrophils Relative % 66 %  ? Neutro Abs 5.7 1.7 - 7.7 K/uL  ? Lymphocytes Relative 25 %  ? Lymphs Abs 2.1 0.7 - 4.0 K/uL  ? Monocytes Relative 6 %  ? Monocytes Absolute 0.5 0.1 - 1.0 K/uL  ? Eosinophils Relative 2 %  ? Eosinophils Absolute 0.1 0.0 - 0.5 K/uL  ?  Basophils Relative 1 %  ? Basophils Absolute 0.1 0.0 - 0.1 K/uL  ? Immature Granulocytes 0 %  ? Abs Immature Granulocytes 0.02 0.00 - 0.07 K/uL  ?  Comment: Performed at [facility], [address] [place], [place] [address]  ?Comprehensive metabolic panel     Status: Abnormal  ? Collection Time: 01/24/22  3:13 PM  ?Result Value Ref Range  ? Sodium 138 135 - 145 mmol/L  ? Potassium 4.2 3.5 - 5.1 mmol/L  ? Chloride 106 98 - 111 mmol/L  ? CO2 22 22 - 32 mmol/L  ? Glucose, Bld 89 70 - 99 mg/dL  ?  Comment: Glucose reference range applies only to samples taken after fasting for at least 8 hours.  ? BUN 26 (H) 8 - 23 mg/dL  ? Creatinine, Ser 1.21 (H) 0.44 - 1.00 mg/dL  ? Calcium 8.7 (L) 8.9 - 10.3 mg/dL  ? Total Protein 7.0 6.5 - 8.1 g/dL  ? Albumin 3.5 3.5 - 5.0 g/dL  ? AST 25 15 - 41 U/L  ? ALT 27 0 - 44 U/L  ? Alkaline Phosphatase 86 38 - 126 U/L  ? Total Bilirubin 0.5 0.3 - 1.2 mg/dL  ? GFR, Estimated 44 (L) >60 mL/min  ?  Comment: (NOTE) ?Calculated using the CKD-EPI Creatinine Equation (2021) ?  ? Anion gap 10 5 - 15  ?  Comment: Performed at [facility], [address] [place], [place] [address]  ?Surgical pathology     Status: None  ? Collection Time: 01/29/22 11:55 AM  ?Result Value Ref Range  ? SURGICAL PATHOLOGY    ?  SURGICAL PATHOLOGY ?CASE: [id number] ?PATIENT: Shuntavia Demore ?Surgical Pathology Report ? ? ? ? ?Clinical History: right breast cancer ? ? ? ? ?FINAL MICROSCOPIC DIAGNOSIS: ? ?A. 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 ? ? ?ONCOLOGY TABLE: ? ?Procedure:  Partial mastectomy ?Specimen Laterality: Right ?Histologic Type: Invasive ductal carcinoma ?Histologic Grade: ?     Glandular (Acinar)/Tubular Differentiation: 3 ?     Nuclear Pleomorphism: 2 ?     Mitotic Rate: 2 ?     [place]

## 2022-02-15 NOTE — Addendum Note (Signed)
Addended by: Brien Mates on: 02/15/2022 03:29 PM ? ? Modules accepted: Orders ? ?

## 2022-03-01 DIAGNOSIS — M25551 Pain in right hip: Secondary | ICD-10-CM | POA: Diagnosis not present

## 2022-03-08 ENCOUNTER — Other Ambulatory Visit (HOSPITAL_COMMUNITY): Payer: Medicare Other

## 2022-04-06 ENCOUNTER — Other Ambulatory Visit (HOSPITAL_COMMUNITY): Payer: Medicare Other

## 2022-04-09 DIAGNOSIS — S72002A Fracture of unspecified part of neck of left femur, initial encounter for closed fracture: Secondary | ICD-10-CM | POA: Diagnosis not present

## 2022-04-09 DIAGNOSIS — K5730 Diverticulosis of large intestine without perforation or abscess without bleeding: Secondary | ICD-10-CM | POA: Diagnosis not present

## 2022-04-09 DIAGNOSIS — R52 Pain, unspecified: Secondary | ICD-10-CM | POA: Diagnosis not present

## 2022-04-09 DIAGNOSIS — M79605 Pain in left leg: Secondary | ICD-10-CM | POA: Diagnosis not present

## 2022-04-09 DIAGNOSIS — W19XXXA Unspecified fall, initial encounter: Secondary | ICD-10-CM | POA: Diagnosis not present

## 2022-04-09 DIAGNOSIS — M47816 Spondylosis without myelopathy or radiculopathy, lumbar region: Secondary | ICD-10-CM | POA: Diagnosis not present

## 2022-04-09 DIAGNOSIS — I10 Essential (primary) hypertension: Secondary | ICD-10-CM | POA: Diagnosis not present

## 2022-04-10 DIAGNOSIS — N1832 Chronic kidney disease, stage 3b: Secondary | ICD-10-CM | POA: Diagnosis not present

## 2022-04-10 DIAGNOSIS — D72829 Elevated white blood cell count, unspecified: Secondary | ICD-10-CM | POA: Diagnosis not present

## 2022-04-10 DIAGNOSIS — C50511 Malignant neoplasm of lower-outer quadrant of right female breast: Secondary | ICD-10-CM | POA: Diagnosis not present

## 2022-04-10 DIAGNOSIS — W19XXXA Unspecified fall, initial encounter: Secondary | ICD-10-CM | POA: Diagnosis not present

## 2022-04-10 DIAGNOSIS — S7292XA Unspecified fracture of left femur, initial encounter for closed fracture: Secondary | ICD-10-CM | POA: Diagnosis not present

## 2022-04-10 DIAGNOSIS — S72002A Fracture of unspecified part of neck of left femur, initial encounter for closed fracture: Secondary | ICD-10-CM | POA: Diagnosis not present

## 2022-04-10 DIAGNOSIS — Z79899 Other long term (current) drug therapy: Secondary | ICD-10-CM | POA: Diagnosis not present

## 2022-04-10 DIAGNOSIS — I129 Hypertensive chronic kidney disease with stage 1 through stage 4 chronic kidney disease, or unspecified chronic kidney disease: Secondary | ICD-10-CM | POA: Diagnosis not present

## 2022-04-11 DIAGNOSIS — Z96642 Presence of left artificial hip joint: Secondary | ICD-10-CM | POA: Diagnosis not present

## 2022-04-11 DIAGNOSIS — S72002A Fracture of unspecified part of neck of left femur, initial encounter for closed fracture: Secondary | ICD-10-CM | POA: Diagnosis not present

## 2022-04-11 DIAGNOSIS — Z79899 Other long term (current) drug therapy: Secondary | ICD-10-CM | POA: Diagnosis not present

## 2022-04-11 DIAGNOSIS — I129 Hypertensive chronic kidney disease with stage 1 through stage 4 chronic kidney disease, or unspecified chronic kidney disease: Secondary | ICD-10-CM | POA: Diagnosis not present

## 2022-04-11 DIAGNOSIS — S72002D Fracture of unspecified part of neck of left femur, subsequent encounter for closed fracture with routine healing: Secondary | ICD-10-CM | POA: Diagnosis not present

## 2022-04-11 DIAGNOSIS — Z471 Aftercare following joint replacement surgery: Secondary | ICD-10-CM | POA: Diagnosis not present

## 2022-04-11 DIAGNOSIS — N183 Chronic kidney disease, stage 3 unspecified: Secondary | ICD-10-CM | POA: Diagnosis not present

## 2022-04-11 DIAGNOSIS — S7292XA Unspecified fracture of left femur, initial encounter for closed fracture: Secondary | ICD-10-CM | POA: Diagnosis not present

## 2022-04-11 DIAGNOSIS — S72052A Unspecified fracture of head of left femur, initial encounter for closed fracture: Secondary | ICD-10-CM | POA: Diagnosis not present

## 2022-04-12 DIAGNOSIS — Z792 Long term (current) use of antibiotics: Secondary | ICD-10-CM | POA: Diagnosis not present

## 2022-04-12 DIAGNOSIS — N183 Chronic kidney disease, stage 3 unspecified: Secondary | ICD-10-CM | POA: Diagnosis not present

## 2022-04-12 DIAGNOSIS — I129 Hypertensive chronic kidney disease with stage 1 through stage 4 chronic kidney disease, or unspecified chronic kidney disease: Secondary | ICD-10-CM | POA: Diagnosis not present

## 2022-04-12 DIAGNOSIS — S72002D Fracture of unspecified part of neck of left femur, subsequent encounter for closed fracture with routine healing: Secondary | ICD-10-CM | POA: Diagnosis not present

## 2022-04-13 ENCOUNTER — Other Ambulatory Visit (HOSPITAL_COMMUNITY): Payer: Medicare Other

## 2022-04-13 DIAGNOSIS — S72002D Fracture of unspecified part of neck of left femur, subsequent encounter for closed fracture with routine healing: Secondary | ICD-10-CM | POA: Diagnosis not present

## 2022-04-13 DIAGNOSIS — Z79899 Other long term (current) drug therapy: Secondary | ICD-10-CM | POA: Diagnosis not present

## 2022-04-13 DIAGNOSIS — B9620 Unspecified Escherichia coli [E. coli] as the cause of diseases classified elsewhere: Secondary | ICD-10-CM | POA: Diagnosis not present

## 2022-04-13 DIAGNOSIS — I129 Hypertensive chronic kidney disease with stage 1 through stage 4 chronic kidney disease, or unspecified chronic kidney disease: Secondary | ICD-10-CM | POA: Diagnosis not present

## 2022-04-13 DIAGNOSIS — N183 Chronic kidney disease, stage 3 unspecified: Secondary | ICD-10-CM | POA: Diagnosis not present

## 2022-04-13 DIAGNOSIS — W19XXXD Unspecified fall, subsequent encounter: Secondary | ICD-10-CM | POA: Diagnosis not present

## 2022-04-14 DIAGNOSIS — I129 Hypertensive chronic kidney disease with stage 1 through stage 4 chronic kidney disease, or unspecified chronic kidney disease: Secondary | ICD-10-CM | POA: Diagnosis not present

## 2022-04-14 DIAGNOSIS — N390 Urinary tract infection, site not specified: Secondary | ICD-10-CM | POA: Diagnosis not present

## 2022-04-14 DIAGNOSIS — N183 Chronic kidney disease, stage 3 unspecified: Secondary | ICD-10-CM | POA: Diagnosis not present

## 2022-04-14 DIAGNOSIS — Z792 Long term (current) use of antibiotics: Secondary | ICD-10-CM | POA: Diagnosis not present

## 2022-04-14 DIAGNOSIS — R339 Retention of urine, unspecified: Secondary | ICD-10-CM | POA: Diagnosis not present

## 2022-04-14 DIAGNOSIS — W19XXXD Unspecified fall, subsequent encounter: Secondary | ICD-10-CM | POA: Diagnosis not present

## 2022-04-14 DIAGNOSIS — Z79899 Other long term (current) drug therapy: Secondary | ICD-10-CM | POA: Diagnosis not present

## 2022-04-14 DIAGNOSIS — B9620 Unspecified Escherichia coli [E. coli] as the cause of diseases classified elsewhere: Secondary | ICD-10-CM | POA: Diagnosis not present

## 2022-04-14 DIAGNOSIS — Z960 Presence of urogenital implants: Secondary | ICD-10-CM | POA: Diagnosis not present

## 2022-04-14 DIAGNOSIS — S72002D Fracture of unspecified part of neck of left femur, subsequent encounter for closed fracture with routine healing: Secondary | ICD-10-CM | POA: Diagnosis not present

## 2022-04-15 DIAGNOSIS — R339 Retention of urine, unspecified: Secondary | ICD-10-CM | POA: Diagnosis not present

## 2022-04-15 DIAGNOSIS — N390 Urinary tract infection, site not specified: Secondary | ICD-10-CM | POA: Diagnosis not present

## 2022-04-15 DIAGNOSIS — Z792 Long term (current) use of antibiotics: Secondary | ICD-10-CM | POA: Diagnosis not present

## 2022-04-15 DIAGNOSIS — B9620 Unspecified Escherichia coli [E. coli] as the cause of diseases classified elsewhere: Secondary | ICD-10-CM | POA: Diagnosis not present

## 2022-04-15 DIAGNOSIS — D631 Anemia in chronic kidney disease: Secondary | ICD-10-CM | POA: Diagnosis not present

## 2022-04-15 DIAGNOSIS — W19XXXD Unspecified fall, subsequent encounter: Secondary | ICD-10-CM | POA: Diagnosis not present

## 2022-04-15 DIAGNOSIS — I129 Hypertensive chronic kidney disease with stage 1 through stage 4 chronic kidney disease, or unspecified chronic kidney disease: Secondary | ICD-10-CM | POA: Diagnosis not present

## 2022-04-15 DIAGNOSIS — S72002D Fracture of unspecified part of neck of left femur, subsequent encounter for closed fracture with routine healing: Secondary | ICD-10-CM | POA: Diagnosis not present

## 2022-04-15 DIAGNOSIS — N183 Chronic kidney disease, stage 3 unspecified: Secondary | ICD-10-CM | POA: Diagnosis not present

## 2022-04-16 DIAGNOSIS — R339 Retention of urine, unspecified: Secondary | ICD-10-CM | POA: Diagnosis not present

## 2022-04-16 DIAGNOSIS — Z9181 History of falling: Secondary | ICD-10-CM | POA: Diagnosis not present

## 2022-04-16 DIAGNOSIS — M80852A Other osteoporosis with current pathological fracture, left femur, initial encounter for fracture: Secondary | ICD-10-CM | POA: Diagnosis not present

## 2022-04-16 DIAGNOSIS — M25552 Pain in left hip: Secondary | ICD-10-CM | POA: Diagnosis not present

## 2022-04-16 DIAGNOSIS — N390 Urinary tract infection, site not specified: Secondary | ICD-10-CM | POA: Diagnosis not present

## 2022-04-16 DIAGNOSIS — S72002A Fracture of unspecified part of neck of left femur, initial encounter for closed fracture: Secondary | ICD-10-CM | POA: Diagnosis not present

## 2022-04-16 DIAGNOSIS — I959 Hypotension, unspecified: Secondary | ICD-10-CM | POA: Diagnosis not present

## 2022-04-16 DIAGNOSIS — R2689 Other abnormalities of gait and mobility: Secondary | ICD-10-CM | POA: Diagnosis not present

## 2022-04-16 DIAGNOSIS — D72829 Elevated white blood cell count, unspecified: Secondary | ICD-10-CM | POA: Diagnosis not present

## 2022-04-16 DIAGNOSIS — R531 Weakness: Secondary | ICD-10-CM | POA: Diagnosis not present

## 2022-04-16 DIAGNOSIS — I129 Hypertensive chronic kidney disease with stage 1 through stage 4 chronic kidney disease, or unspecified chronic kidney disease: Secondary | ICD-10-CM | POA: Diagnosis not present

## 2022-04-16 DIAGNOSIS — D631 Anemia in chronic kidney disease: Secondary | ICD-10-CM | POA: Diagnosis not present

## 2022-04-16 DIAGNOSIS — T68XXXA Hypothermia, initial encounter: Secondary | ICD-10-CM | POA: Diagnosis not present

## 2022-04-16 DIAGNOSIS — R41841 Cognitive communication deficit: Secondary | ICD-10-CM | POA: Diagnosis not present

## 2022-04-16 DIAGNOSIS — N183 Chronic kidney disease, stage 3 unspecified: Secondary | ICD-10-CM | POA: Diagnosis not present

## 2022-04-16 DIAGNOSIS — Z960 Presence of urogenital implants: Secondary | ICD-10-CM | POA: Diagnosis not present

## 2022-04-16 DIAGNOSIS — M6281 Muscle weakness (generalized): Secondary | ICD-10-CM | POA: Diagnosis not present

## 2022-04-16 DIAGNOSIS — N1831 Chronic kidney disease, stage 3a: Secondary | ICD-10-CM | POA: Diagnosis not present

## 2022-04-16 DIAGNOSIS — I10 Essential (primary) hypertension: Secondary | ICD-10-CM | POA: Diagnosis not present

## 2022-04-16 DIAGNOSIS — Z7901 Long term (current) use of anticoagulants: Secondary | ICD-10-CM | POA: Diagnosis not present

## 2022-04-16 DIAGNOSIS — S72002D Fracture of unspecified part of neck of left femur, subsequent encounter for closed fracture with routine healing: Secondary | ICD-10-CM | POA: Diagnosis not present

## 2022-04-16 DIAGNOSIS — N1832 Chronic kidney disease, stage 3b: Secondary | ICD-10-CM | POA: Diagnosis not present

## 2022-04-16 DIAGNOSIS — B9620 Unspecified Escherichia coli [E. coli] as the cause of diseases classified elsewhere: Secondary | ICD-10-CM | POA: Diagnosis not present

## 2022-04-16 DIAGNOSIS — C50311 Malignant neoplasm of lower-inner quadrant of right female breast: Secondary | ICD-10-CM | POA: Diagnosis not present

## 2022-04-16 DIAGNOSIS — Z170 Estrogen receptor positive status [ER+]: Secondary | ICD-10-CM | POA: Diagnosis not present

## 2022-04-17 DIAGNOSIS — I10 Essential (primary) hypertension: Secondary | ICD-10-CM | POA: Diagnosis not present

## 2022-04-17 DIAGNOSIS — R531 Weakness: Secondary | ICD-10-CM | POA: Diagnosis not present

## 2022-04-17 DIAGNOSIS — N1831 Chronic kidney disease, stage 3a: Secondary | ICD-10-CM | POA: Diagnosis not present

## 2022-04-17 DIAGNOSIS — M80852A Other osteoporosis with current pathological fracture, left femur, initial encounter for fracture: Secondary | ICD-10-CM | POA: Diagnosis not present

## 2022-04-26 DIAGNOSIS — M25552 Pain in left hip: Secondary | ICD-10-CM | POA: Diagnosis not present

## 2022-04-30 ENCOUNTER — Ambulatory Visit (INDEPENDENT_AMBULATORY_CARE_PROVIDER_SITE_OTHER): Payer: Medicare Other | Admitting: Urology

## 2022-04-30 ENCOUNTER — Encounter: Payer: Self-pay | Admitting: Urology

## 2022-04-30 ENCOUNTER — Ambulatory Visit: Payer: Medicare Other | Admitting: Urology

## 2022-04-30 VITALS — BP 126/64 | HR 96

## 2022-04-30 DIAGNOSIS — R339 Retention of urine, unspecified: Secondary | ICD-10-CM | POA: Diagnosis not present

## 2022-05-17 ENCOUNTER — Other Ambulatory Visit (HOSPITAL_COMMUNITY): Payer: Medicare Other

## 2022-05-24 ENCOUNTER — Ambulatory Visit (HOSPITAL_COMMUNITY): Payer: Medicare Other | Admitting: Hematology

## 2022-05-24 DIAGNOSIS — N181 Chronic kidney disease, stage 1: Secondary | ICD-10-CM | POA: Diagnosis not present

## 2022-05-24 DIAGNOSIS — F411 Generalized anxiety disorder: Secondary | ICD-10-CM | POA: Diagnosis not present

## 2022-05-24 DIAGNOSIS — E7849 Other hyperlipidemia: Secondary | ICD-10-CM | POA: Diagnosis not present

## 2022-05-24 DIAGNOSIS — I10 Essential (primary) hypertension: Secondary | ICD-10-CM | POA: Diagnosis not present

## 2022-05-28 DIAGNOSIS — M25552 Pain in left hip: Secondary | ICD-10-CM | POA: Diagnosis not present

## 2022-05-30 ENCOUNTER — Ambulatory Visit: Payer: Medicare Other | Admitting: Physician Assistant

## 2022-07-11 DIAGNOSIS — Z96642 Presence of left artificial hip joint: Secondary | ICD-10-CM | POA: Diagnosis not present

## 2022-07-11 DIAGNOSIS — M25552 Pain in left hip: Secondary | ICD-10-CM | POA: Diagnosis not present

## 2022-10-10 DIAGNOSIS — I10 Essential (primary) hypertension: Secondary | ICD-10-CM | POA: Diagnosis not present

## 2022-10-10 DIAGNOSIS — E7849 Other hyperlipidemia: Secondary | ICD-10-CM | POA: Diagnosis not present

## 2022-10-10 DIAGNOSIS — N181 Chronic kidney disease, stage 1: Secondary | ICD-10-CM | POA: Diagnosis not present

## 2022-10-10 DIAGNOSIS — F411 Generalized anxiety disorder: Secondary | ICD-10-CM | POA: Diagnosis not present

## 2022-11-20 DIAGNOSIS — M810 Age-related osteoporosis without current pathological fracture: Secondary | ICD-10-CM | POA: Diagnosis not present

## 2023-01-02 DIAGNOSIS — Z08 Encounter for follow-up examination after completed treatment for malignant neoplasm: Secondary | ICD-10-CM | POA: Diagnosis not present

## 2023-01-02 DIAGNOSIS — Z853 Personal history of malignant neoplasm of breast: Secondary | ICD-10-CM | POA: Diagnosis not present

## 2023-01-10 DIAGNOSIS — M25552 Pain in left hip: Secondary | ICD-10-CM | POA: Diagnosis not present

## 2023-01-16 DIAGNOSIS — M818 Other osteoporosis without current pathological fracture: Secondary | ICD-10-CM | POA: Diagnosis not present

## 2023-01-16 DIAGNOSIS — F411 Generalized anxiety disorder: Secondary | ICD-10-CM | POA: Diagnosis not present

## 2023-01-16 DIAGNOSIS — I10 Essential (primary) hypertension: Secondary | ICD-10-CM | POA: Diagnosis not present

## 2023-01-16 DIAGNOSIS — N1831 Chronic kidney disease, stage 3a: Secondary | ICD-10-CM | POA: Diagnosis not present

## 2023-01-16 DIAGNOSIS — E7849 Other hyperlipidemia: Secondary | ICD-10-CM | POA: Diagnosis not present

## 2023-01-18 DIAGNOSIS — F411 Generalized anxiety disorder: Secondary | ICD-10-CM | POA: Diagnosis not present

## 2023-01-18 DIAGNOSIS — M818 Other osteoporosis without current pathological fracture: Secondary | ICD-10-CM | POA: Diagnosis not present

## 2023-01-18 DIAGNOSIS — I10 Essential (primary) hypertension: Secondary | ICD-10-CM | POA: Diagnosis not present

## 2023-01-18 DIAGNOSIS — N1831 Chronic kidney disease, stage 3a: Secondary | ICD-10-CM | POA: Diagnosis not present

## 2023-01-18 DIAGNOSIS — E7849 Other hyperlipidemia: Secondary | ICD-10-CM | POA: Diagnosis not present

## 2023-05-15 DIAGNOSIS — E7849 Other hyperlipidemia: Secondary | ICD-10-CM | POA: Diagnosis not present

## 2023-05-15 DIAGNOSIS — M818 Other osteoporosis without current pathological fracture: Secondary | ICD-10-CM | POA: Diagnosis not present

## 2023-05-15 DIAGNOSIS — N1831 Chronic kidney disease, stage 3a: Secondary | ICD-10-CM | POA: Diagnosis not present

## 2023-05-15 DIAGNOSIS — Z1331 Encounter for screening for depression: Secondary | ICD-10-CM | POA: Diagnosis not present

## 2023-05-15 DIAGNOSIS — F411 Generalized anxiety disorder: Secondary | ICD-10-CM | POA: Diagnosis not present

## 2023-05-15 DIAGNOSIS — I10 Essential (primary) hypertension: Secondary | ICD-10-CM | POA: Diagnosis not present

## 2023-05-15 DIAGNOSIS — Z0000 Encounter for general adult medical examination without abnormal findings: Secondary | ICD-10-CM | POA: Diagnosis not present

## 2023-07-26 IMAGING — DX DG CHEST 2V
2 series · 2 of 2 positions shown · non-contrast
Comparison: Report 08/23/2016

CLINICAL DATA: Preop right breast surgery

EXAM:
CHEST - 2 VIEW

[chest pa]
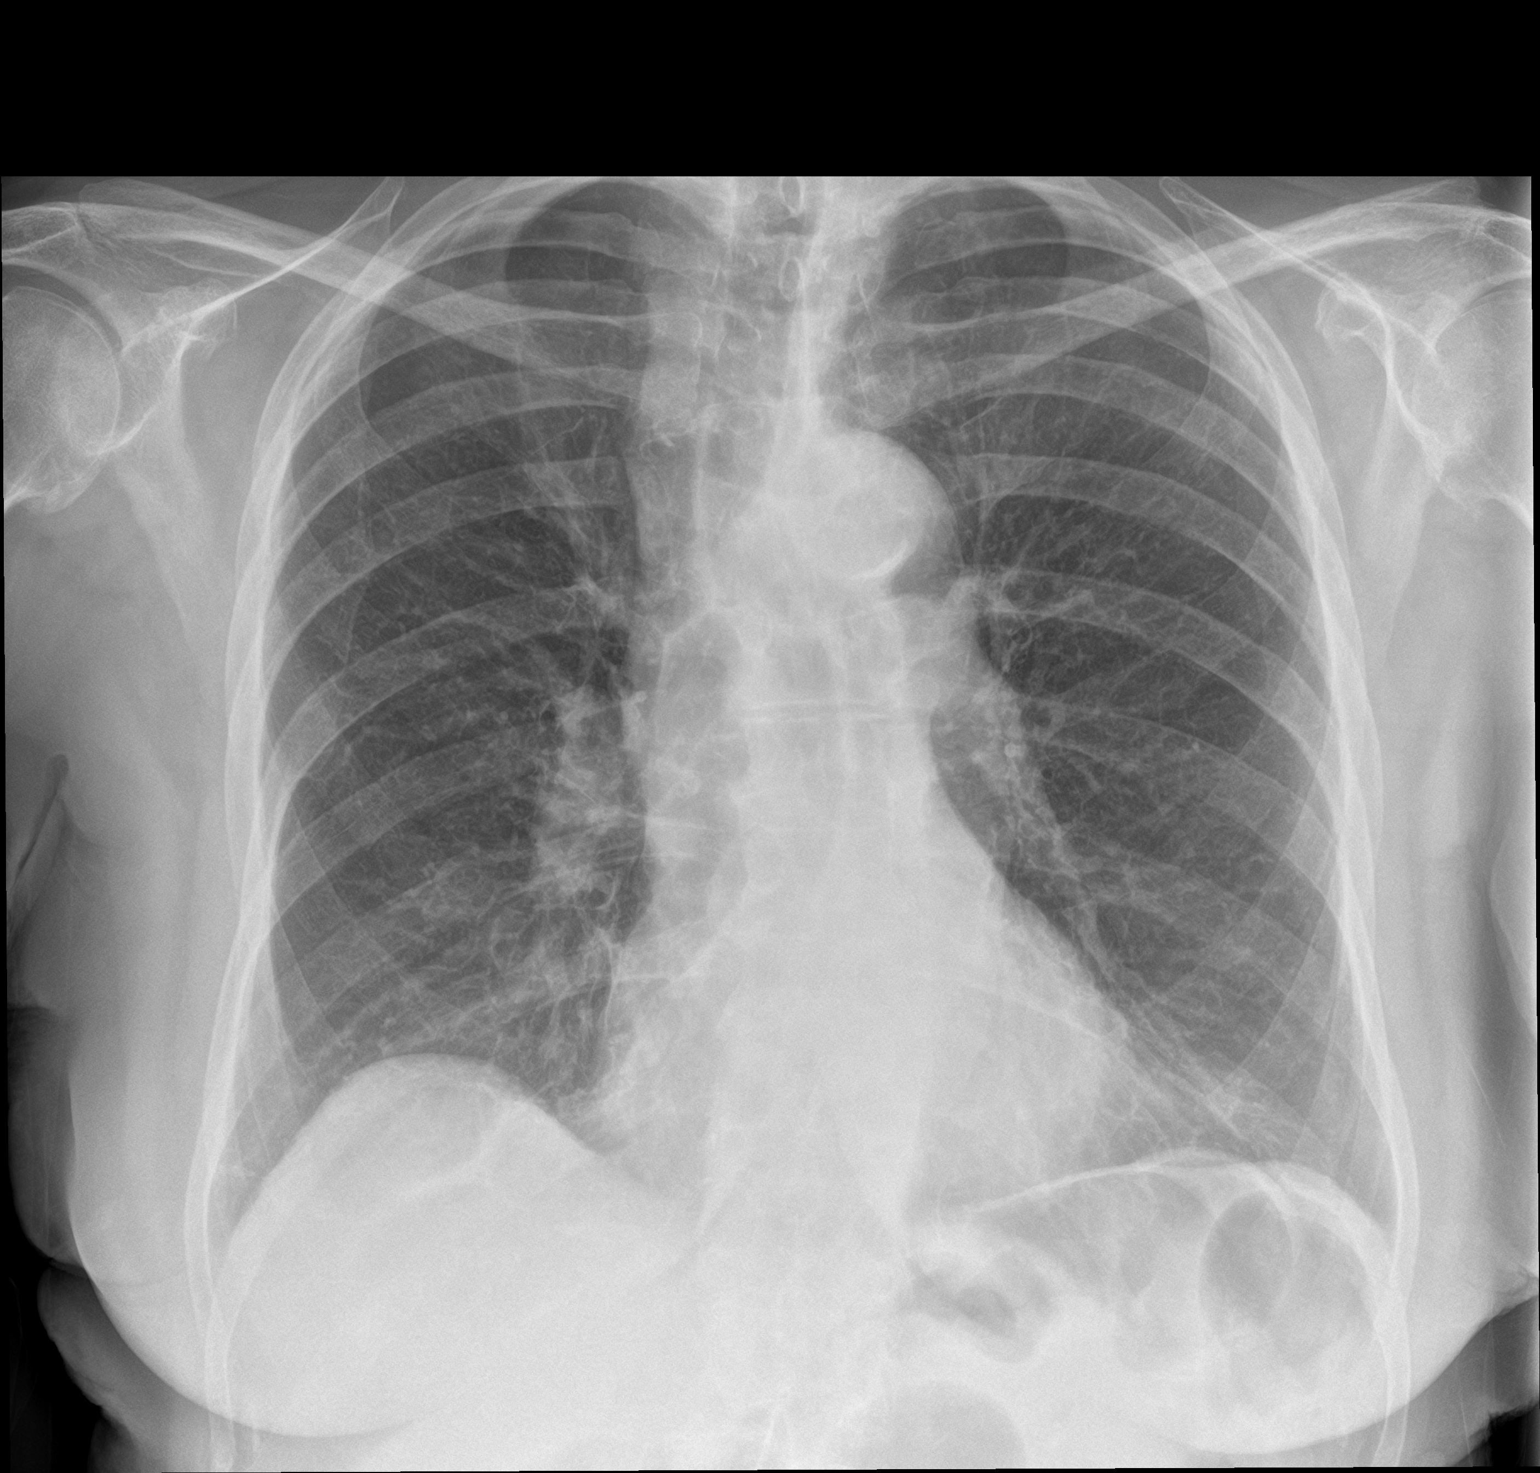

[chest lat]
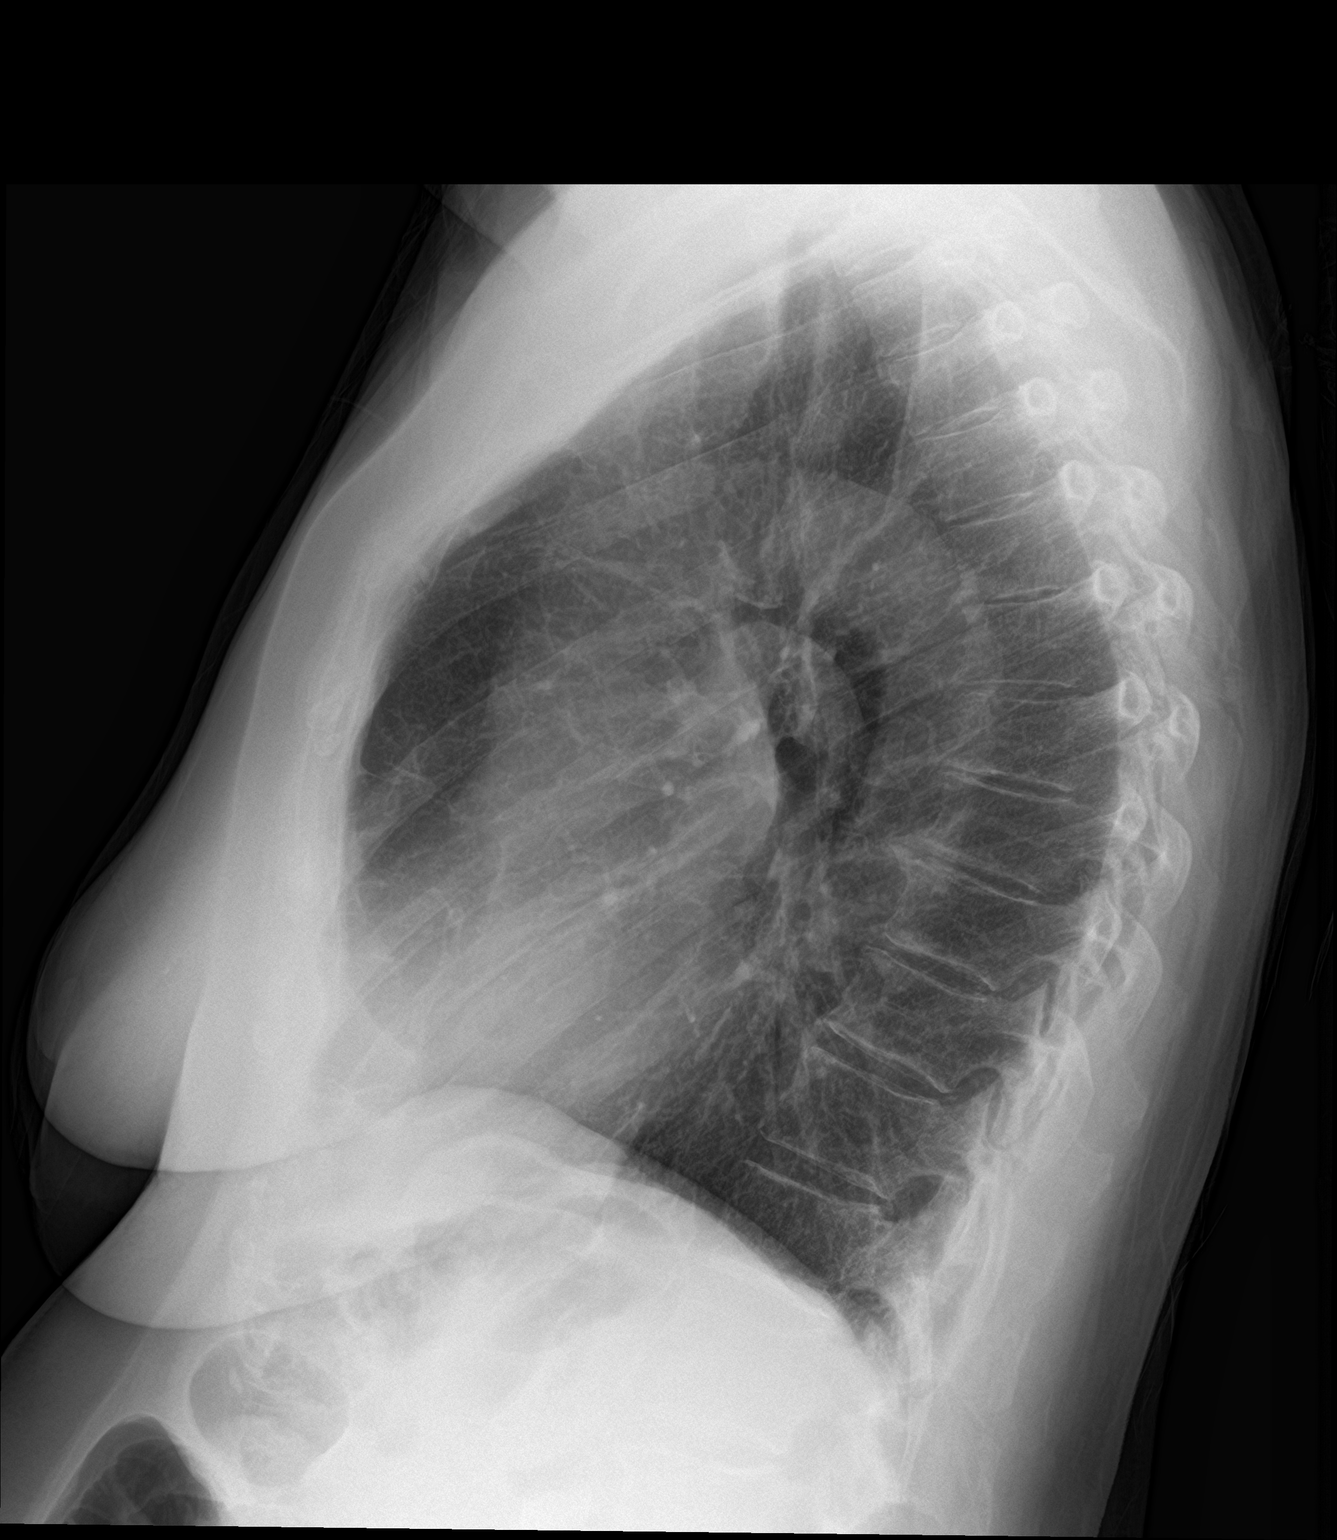

[2 of 2 positions shown; findings below may reference images not displayed]

FINDINGS: The heart size and mediastinal contours are within normal limits.
Aortic atherosclerosis. Both lungs are clear. The visualized
skeletal structures are unremarkable. Minimal scarring at the bases.
IMPRESSION: No active cardiopulmonary disease.

## 2023-09-25 DIAGNOSIS — M818 Other osteoporosis without current pathological fracture: Secondary | ICD-10-CM | POA: Diagnosis not present

## 2023-09-25 DIAGNOSIS — N1831 Chronic kidney disease, stage 3a: Secondary | ICD-10-CM | POA: Diagnosis not present

## 2023-09-25 DIAGNOSIS — Z0000 Encounter for general adult medical examination without abnormal findings: Secondary | ICD-10-CM | POA: Diagnosis not present

## 2023-09-25 DIAGNOSIS — I10 Essential (primary) hypertension: Secondary | ICD-10-CM | POA: Diagnosis not present

## 2023-09-25 DIAGNOSIS — E7849 Other hyperlipidemia: Secondary | ICD-10-CM | POA: Diagnosis not present

## 2023-09-25 DIAGNOSIS — F411 Generalized anxiety disorder: Secondary | ICD-10-CM | POA: Diagnosis not present

## 2024-01-21 DIAGNOSIS — I10 Essential (primary) hypertension: Secondary | ICD-10-CM | POA: Diagnosis not present

## 2024-01-21 DIAGNOSIS — M818 Other osteoporosis without current pathological fracture: Secondary | ICD-10-CM | POA: Diagnosis not present

## 2024-01-21 DIAGNOSIS — F411 Generalized anxiety disorder: Secondary | ICD-10-CM | POA: Diagnosis not present

## 2024-01-21 DIAGNOSIS — N1831 Chronic kidney disease, stage 3a: Secondary | ICD-10-CM | POA: Diagnosis not present

## 2024-01-21 DIAGNOSIS — Z0000 Encounter for general adult medical examination without abnormal findings: Secondary | ICD-10-CM | POA: Diagnosis not present

## 2024-01-21 DIAGNOSIS — Z8673 Personal history of transient ischemic attack (TIA), and cerebral infarction without residual deficits: Secondary | ICD-10-CM | POA: Diagnosis not present

## 2024-01-21 DIAGNOSIS — G458 Other transient cerebral ischemic attacks and related syndromes: Secondary | ICD-10-CM | POA: Diagnosis not present

## 2024-01-21 DIAGNOSIS — E7849 Other hyperlipidemia: Secondary | ICD-10-CM | POA: Diagnosis not present

## 2024-03-02 DIAGNOSIS — Z08 Encounter for follow-up examination after completed treatment for malignant neoplasm: Secondary | ICD-10-CM | POA: Diagnosis not present

## 2024-03-02 DIAGNOSIS — Z853 Personal history of malignant neoplasm of breast: Secondary | ICD-10-CM | POA: Diagnosis not present

## 2024-05-26 DIAGNOSIS — M818 Other osteoporosis without current pathological fracture: Secondary | ICD-10-CM | POA: Diagnosis not present

## 2024-05-26 DIAGNOSIS — E7849 Other hyperlipidemia: Secondary | ICD-10-CM | POA: Diagnosis not present

## 2024-05-26 DIAGNOSIS — N1831 Chronic kidney disease, stage 3a: Secondary | ICD-10-CM | POA: Diagnosis not present

## 2024-05-26 DIAGNOSIS — Z0000 Encounter for general adult medical examination without abnormal findings: Secondary | ICD-10-CM | POA: Diagnosis not present

## 2024-05-26 DIAGNOSIS — F411 Generalized anxiety disorder: Secondary | ICD-10-CM | POA: Diagnosis not present

## 2024-05-26 DIAGNOSIS — I10 Essential (primary) hypertension: Secondary | ICD-10-CM | POA: Diagnosis not present

## 2024-05-26 DIAGNOSIS — Z8673 Personal history of transient ischemic attack (TIA), and cerebral infarction without residual deficits: Secondary | ICD-10-CM | POA: Diagnosis not present

## 2024-05-26 DIAGNOSIS — Z1331 Encounter for screening for depression: Secondary | ICD-10-CM | POA: Diagnosis not present

## 2024-09-24 DIAGNOSIS — I77819 Aortic ectasia, unspecified site: Secondary | ICD-10-CM | POA: Diagnosis not present

## 2024-09-24 DIAGNOSIS — I10 Essential (primary) hypertension: Secondary | ICD-10-CM | POA: Diagnosis not present

## 2024-09-24 DIAGNOSIS — B9620 Unspecified Escherichia coli [E. coli] as the cause of diseases classified elsewhere: Secondary | ICD-10-CM | POA: Diagnosis present

## 2024-09-24 DIAGNOSIS — R296 Repeated falls: Secondary | ICD-10-CM | POA: Diagnosis not present

## 2024-09-24 DIAGNOSIS — N3001 Acute cystitis with hematuria: Secondary | ICD-10-CM | POA: Diagnosis not present

## 2024-09-24 DIAGNOSIS — I6781 Acute cerebrovascular insufficiency: Secondary | ICD-10-CM | POA: Diagnosis not present

## 2024-09-24 DIAGNOSIS — Z9889 Other specified postprocedural states: Secondary | ICD-10-CM | POA: Diagnosis not present

## 2024-09-24 DIAGNOSIS — N1831 Chronic kidney disease, stage 3a: Secondary | ICD-10-CM | POA: Diagnosis not present

## 2024-09-24 DIAGNOSIS — D631 Anemia in chronic kidney disease: Secondary | ICD-10-CM | POA: Diagnosis present

## 2024-09-24 DIAGNOSIS — R509 Fever, unspecified: Secondary | ICD-10-CM | POA: Diagnosis not present

## 2024-09-24 DIAGNOSIS — D649 Anemia, unspecified: Secondary | ICD-10-CM | POA: Diagnosis not present

## 2024-09-24 DIAGNOSIS — R531 Weakness: Secondary | ICD-10-CM | POA: Diagnosis not present

## 2024-09-24 DIAGNOSIS — Z602 Problems related to living alone: Secondary | ICD-10-CM | POA: Diagnosis present

## 2024-09-24 DIAGNOSIS — Z967 Presence of other bone and tendon implants: Secondary | ICD-10-CM | POA: Diagnosis present

## 2024-09-24 DIAGNOSIS — R41841 Cognitive communication deficit: Secondary | ICD-10-CM | POA: Diagnosis not present

## 2024-09-24 DIAGNOSIS — G8929 Other chronic pain: Secondary | ICD-10-CM | POA: Diagnosis not present

## 2024-09-24 DIAGNOSIS — B20 Human immunodeficiency virus [HIV] disease: Secondary | ICD-10-CM | POA: Diagnosis not present

## 2024-09-24 DIAGNOSIS — Z7901 Long term (current) use of anticoagulants: Secondary | ICD-10-CM | POA: Diagnosis not present

## 2024-09-24 DIAGNOSIS — Z792 Long term (current) use of antibiotics: Secondary | ICD-10-CM | POA: Diagnosis not present

## 2024-09-24 DIAGNOSIS — D72829 Elevated white blood cell count, unspecified: Secondary | ICD-10-CM | POA: Diagnosis present

## 2024-09-24 DIAGNOSIS — G9349 Other encephalopathy: Secondary | ICD-10-CM | POA: Diagnosis present

## 2024-09-24 DIAGNOSIS — R52 Pain, unspecified: Secondary | ICD-10-CM | POA: Diagnosis not present

## 2024-09-24 DIAGNOSIS — Z79899 Other long term (current) drug therapy: Secondary | ICD-10-CM | POA: Diagnosis not present

## 2024-09-24 DIAGNOSIS — N3000 Acute cystitis without hematuria: Secondary | ICD-10-CM | POA: Diagnosis present

## 2024-09-24 DIAGNOSIS — R9082 White matter disease, unspecified: Secondary | ICD-10-CM | POA: Diagnosis not present

## 2024-09-24 DIAGNOSIS — Z79811 Long term (current) use of aromatase inhibitors: Secondary | ICD-10-CM | POA: Diagnosis not present

## 2024-09-24 DIAGNOSIS — R5381 Other malaise: Secondary | ICD-10-CM | POA: Diagnosis not present

## 2024-09-24 DIAGNOSIS — Z7982 Long term (current) use of aspirin: Secondary | ICD-10-CM | POA: Diagnosis not present

## 2024-09-24 DIAGNOSIS — Z1152 Encounter for screening for COVID-19: Secondary | ICD-10-CM | POA: Diagnosis not present

## 2024-09-24 DIAGNOSIS — R1084 Generalized abdominal pain: Secondary | ICD-10-CM | POA: Diagnosis not present

## 2024-09-24 DIAGNOSIS — Z8673 Personal history of transient ischemic attack (TIA), and cerebral infarction without residual deficits: Secondary | ICD-10-CM | POA: Diagnosis not present

## 2024-09-24 DIAGNOSIS — N390 Urinary tract infection, site not specified: Secondary | ICD-10-CM | POA: Diagnosis not present

## 2024-09-24 DIAGNOSIS — R0989 Other specified symptoms and signs involving the circulatory and respiratory systems: Secondary | ICD-10-CM | POA: Diagnosis not present

## 2024-09-24 DIAGNOSIS — R262 Difficulty in walking, not elsewhere classified: Secondary | ICD-10-CM | POA: Diagnosis not present

## 2024-09-24 DIAGNOSIS — N1832 Chronic kidney disease, stage 3b: Secondary | ICD-10-CM | POA: Diagnosis present

## 2024-09-24 DIAGNOSIS — I129 Hypertensive chronic kidney disease with stage 1 through stage 4 chronic kidney disease, or unspecified chronic kidney disease: Secondary | ICD-10-CM | POA: Diagnosis present

## 2024-09-24 DIAGNOSIS — H9190 Unspecified hearing loss, unspecified ear: Secondary | ICD-10-CM | POA: Diagnosis present

## 2024-09-24 DIAGNOSIS — G9340 Encephalopathy, unspecified: Secondary | ICD-10-CM | POA: Diagnosis not present

## 2024-09-24 DIAGNOSIS — G9389 Other specified disorders of brain: Secondary | ICD-10-CM | POA: Diagnosis not present

## 2024-09-24 DIAGNOSIS — G4489 Other headache syndrome: Secondary | ICD-10-CM | POA: Diagnosis not present

## 2024-09-24 DIAGNOSIS — Z853 Personal history of malignant neoplasm of breast: Secondary | ICD-10-CM | POA: Diagnosis not present

## 2024-09-29 DIAGNOSIS — Z8673 Personal history of transient ischemic attack (TIA), and cerebral infarction without residual deficits: Secondary | ICD-10-CM | POA: Diagnosis not present

## 2024-09-29 DIAGNOSIS — Z853 Personal history of malignant neoplasm of breast: Secondary | ICD-10-CM | POA: Diagnosis not present

## 2024-09-29 DIAGNOSIS — R41841 Cognitive communication deficit: Secondary | ICD-10-CM | POA: Diagnosis not present

## 2024-09-29 DIAGNOSIS — N3000 Acute cystitis without hematuria: Secondary | ICD-10-CM | POA: Diagnosis not present

## 2024-09-29 DIAGNOSIS — R531 Weakness: Secondary | ICD-10-CM | POA: Diagnosis not present

## 2024-09-29 DIAGNOSIS — D649 Anemia, unspecified: Secondary | ICD-10-CM | POA: Diagnosis not present

## 2024-09-29 DIAGNOSIS — N1832 Chronic kidney disease, stage 3b: Secondary | ICD-10-CM | POA: Diagnosis not present

## 2024-09-29 DIAGNOSIS — Z792 Long term (current) use of antibiotics: Secondary | ICD-10-CM | POA: Diagnosis not present

## 2024-09-29 DIAGNOSIS — I10 Essential (primary) hypertension: Secondary | ICD-10-CM | POA: Diagnosis not present

## 2024-09-29 DIAGNOSIS — G8929 Other chronic pain: Secondary | ICD-10-CM | POA: Diagnosis not present

## 2024-09-29 DIAGNOSIS — R296 Repeated falls: Secondary | ICD-10-CM | POA: Diagnosis not present

## 2024-09-29 DIAGNOSIS — R262 Difficulty in walking, not elsewhere classified: Secondary | ICD-10-CM | POA: Diagnosis not present

## 2024-09-29 DIAGNOSIS — G9340 Encephalopathy, unspecified: Secondary | ICD-10-CM | POA: Diagnosis not present

## 2024-09-29 DIAGNOSIS — N3001 Acute cystitis with hematuria: Secondary | ICD-10-CM | POA: Diagnosis not present

## 2024-09-29 DIAGNOSIS — R5381 Other malaise: Secondary | ICD-10-CM | POA: Diagnosis not present

## 2024-09-29 DIAGNOSIS — R52 Pain, unspecified: Secondary | ICD-10-CM | POA: Diagnosis not present
